# Patient Record
Sex: Female | Born: 1980 | Race: Black or African American | Hispanic: No | Marital: Married | Smoking: Never smoker
Health system: Southern US, Community
[De-identification: ages and names within clinical notes are randomized; demographics above are authoritative.]

## PROBLEM LIST (undated history)

## (undated) DIAGNOSIS — G4730 Sleep apnea, unspecified: Secondary | ICD-10-CM

## (undated) DIAGNOSIS — Z973 Presence of spectacles and contact lenses: Secondary | ICD-10-CM

## (undated) DIAGNOSIS — I499 Cardiac arrhythmia, unspecified: Secondary | ICD-10-CM

## (undated) DIAGNOSIS — F319 Bipolar disorder, unspecified: Secondary | ICD-10-CM

## (undated) DIAGNOSIS — F419 Anxiety disorder, unspecified: Secondary | ICD-10-CM

## (undated) DIAGNOSIS — M1990 Unspecified osteoarthritis, unspecified site: Secondary | ICD-10-CM

## (undated) DIAGNOSIS — F32A Depression, unspecified: Secondary | ICD-10-CM

## (undated) DIAGNOSIS — Z87442 Personal history of urinary calculi: Secondary | ICD-10-CM

## (undated) HISTORY — PX: LIPOMA RESECTION: SHX23

## (undated) HISTORY — PX: CERVICAL CERCLAGE: SHX1329

## (undated) HISTORY — PX: ABLATION: SHX5711

---

## 2018-02-11 ENCOUNTER — Ambulatory Visit (HOSPITAL_COMMUNITY)
Admission: EM | Admit: 2018-02-11 | Discharge: 2018-02-11 | Disposition: A | Discharge status: Home / Self Care | Payer: BLUE CROSS/BLUE SHIELD | Attending: Family Medicine | Admitting: Family Medicine

## 2018-02-11 ENCOUNTER — Encounter (HOSPITAL_COMMUNITY): Payer: Self-pay | Admitting: Emergency Medicine

## 2018-02-11 DIAGNOSIS — J209 Acute bronchitis, unspecified: Secondary | ICD-10-CM

## 2018-02-11 MED ORDER — HYDROCODONE-HOMATROPINE 5-1.5 MG/5ML PO SYRP: 5.0000 mL | ORAL_SOLUTION | Freq: Every evening | ORAL | 0 refills | Status: DC | PRN

## 2018-02-11 MED ORDER — PREDNISONE 50 MG PO TABS: 50.0000 mg | ORAL_TABLET | Freq: Every day | ORAL | 0 refills | Status: AC

## 2018-02-11 MED ORDER — DOXYCYCLINE HYCLATE 100 MG PO CAPS: 100.0000 mg | ORAL_CAPSULE | Freq: Two times a day (BID) | ORAL | 0 refills | Status: AC

## 2018-02-11 MED ORDER — ALBUTEROL SULFATE HFA 108 (90 BASE) MCG/ACT IN AERS: 1.0000 | INHALATION_SPRAY | Freq: Four times a day (QID) | RESPIRATORY_TRACT | 0 refills | Status: DC | PRN

## 2018-02-11 NOTE — Discharge Instructions (Signed)
Please begin prednisone daily with food for the next 5 days to help with cough and inflammation and chest Albuterol inhaler 1 to 2 puffs as needed every 4-6 hours for shortness of breath, chest tightness, wheezing May use cough syrup provided at bedtime to help with rest, this will cause sleepiness, do not use during the day, while working or driving Please use Robitussin, Delsym, Robitussin-DM during the day for cough Please use the above consistently over the next 3 days, may fill prescription for antibiotic on Sunday if not having any improvement with the above  Please follow-up if developing fever, worsening shortness of breath, difficulty breathing, and chest pain, persistent symptoms

## 2018-02-11 NOTE — ED Provider Notes (Signed)
[facility]    CSN: [id number] Arrival date & time: 02/11/18  1757     History   Chief Complaint Chief Complaint  Patient presents with  . Cough    HPI Erica Neal is a 37 y.o. female no significant past medical history presenting today for evaluation of a cough.  Patient has had a cough for approximately 1 week.  She has noticed it worse at night.  Over the past 2 to 3 days she has had increasing mucus production has developed wheezing and rattling in her chest.  The wheezing comes and goes.  She states that she has minimal head symptoms including congestion, rhinorrhea and sore throat.  Denies fevers.  She has tried Mucinex, Delsym, Robitussin and Sudafed over-the-counter without relief.  Her main complaint is poor sleep due to cough and feeling fatigued.  Denies chest pain.  Denies nausea or vomiting.  Denies history of smoking or asthma.  HPI  History reviewed. No pertinent past medical history.  There are no active problems to display for this patient.   History reviewed. No pertinent surgical history.  OB History   None      Home Medications    Prior to Admission medications   Medication Sig Start Date End Date Taking? Authorizing Provider  albuterol (PROVENTIL HFA;VENTOLIN HFA) 108 (90 Base) MCG/ACT inhaler Inhale 1-2 puffs into the lungs every 6 (six) hours as needed for wheezing or shortness of breath. 02/11/18   Margaruite Top C, PA-Neal  doxycycline (VIBRAMYCIN) 100 MG capsule Take 1 capsule (100 mg total) by mouth 2 (two) times daily for 7 days. 02/11/18 02/18/18  Yavier Snider C, PA-Neal  HYDROcodone-homatropine (HYCODAN) 5-1.5 MG/5ML syrup Take 5 mLs by mouth at bedtime as needed for cough. 02/11/18   Malary Aylesworth C, PA-Neal  predniSONE (DELTASONE) 50 MG tablet Take 1 tablet (50 mg total) by mouth daily for 5 days. 02/11/18 02/16/18  Micheal Sheen, Elesa Hacker, PA-Neal    Family History Family History  Problem Relation Age of Onset  . Cancer Father   .  Diabetes Maternal Grandmother     Social History Social History   Tobacco Use  . Smoking status: Never Smoker  Substance Use Topics  . Alcohol use: Yes  . Drug use: Never     Allergies   Erythromycin and Macrobid [nitrofurantoin]   Review of Systems Review of Systems  Constitutional: Positive for fatigue. Negative for activity change, appetite change, chills and fever.  HENT: Negative for congestion, ear pain, rhinorrhea, sinus pressure, sore throat and trouble swallowing.   Eyes: Negative for discharge and redness.  Respiratory: Positive for cough and wheezing. Negative for chest tightness and shortness of breath.   Cardiovascular: Negative for chest pain.  Gastrointestinal: Negative for abdominal pain, diarrhea, nausea and vomiting.  Musculoskeletal: Negative for myalgias.  Skin: Negative for rash.  Neurological: Negative for dizziness, light-headedness and headaches.     Physical Exam Triage Vital Signs ED Triage Vitals [02/11/18 1810]  Enc Vitals Group     BP (!) 143/74     Pulse Rate 79     Resp 16     Temp 98.3 F (36.8 Neal)     Temp src      SpO2 100 %     Weight      Height      Head Circumference      Peak Flow      Pain Score 0     Pain Loc  Pain Edu?      Excl. in [place]?    No data found.  Updated Vital Signs BP (!) 143/74   Pulse 79   Temp 98.3 F (36.8 Neal)   Resp 16   LMP 01/22/2018   SpO2 100%   Visual Acuity Right Eye Distance:   Left Eye Distance:   Bilateral Distance:    Right Eye Near:   Left Eye Near:    Bilateral Near:     Physical Exam  Constitutional: She appears well-developed and well-nourished. No distress.  HENT:  Head: Normocephalic and atraumatic.  Bilateral ears without tenderness to palpation of external auricle, tragus and mastoid, EAC's without erythema or swelling, TM's with good bony landmarks and cone of light. Non erythematous.  Nasal mucosa pink without rhinorrhea  Oral mucosa pink and moist, no  tonsillar enlargement or exudate. Posterior pharynx patent and nonerythematous, no uvula deviation or swelling. Normal phonation.  Eyes: Conjunctivae are normal.  Neck: Neck supple.  Cardiovascular: Normal rate and regular rhythm.  No murmur heard. Pulmonary/Chest: Effort normal and breath sounds normal. No respiratory distress.  Frequent bronchospasm/cough with inspiration in room with wheezing, but no wheezing or other adventitious sounds auscultated with inspiration  Abdominal: Soft. There is no tenderness.  Musculoskeletal: She exhibits no edema.  Neurological: She is alert.  Skin: Skin is warm and dry.  Psychiatric: She has a normal mood and affect.  Nursing note and vitals reviewed.    UC Treatments / Results  Labs (all labs ordered are listed, but only abnormal results are displayed) Labs Reviewed - No data to display  EKG None  Radiology No results found.  Procedures Procedures (including critical care time)  Medications Ordered in UC Medications - No data to display  Initial Impression / Assessment and Plan / UC Course  I have reviewed the triage vital signs and the nursing notes.  Pertinent labs & imaging results that were available during my care of the patient were reviewed by me and considered in my medical decision making (see chart for details).    Patient likely with bronchitis, symptoms seem concentrated at the chest, vital signs stable, no rales or crackles auscultated, will treat for bronchitis with prednisone, albuterol and cough syrup.  Did provide Hycodan to use only at bedtime.  Robitussin and Delsym during the day.  Provided printed prescription of doxycycline as patient allergic to erythromycin; was instructed to fill in 3 to 4 days if not having improvement with above.  Continue to monitor symptoms, breathing, Discussed strict return precautions. Patient verbalized understanding and is agreeable with plan.   Final Clinical Impressions(s) / UC  Diagnoses   Final diagnoses:  Acute bronchitis, unspecified organism     Discharge Instructions     Please begin prednisone daily with food for the next 5 days to help with cough and inflammation and chest Albuterol inhaler 1 to 2 puffs as needed every 4-6 hours for shortness of breath, chest tightness, wheezing May use cough syrup provided at bedtime to help with rest, this will cause sleepiness, do not use during the day, while working or driving Please use Robitussin, Delsym, Robitussin-DM during the day for cough Please use the above consistently over the next 3 days, may fill prescription for antibiotic on Sunday if not having any improvement with the above  Please follow-up if developing fever, worsening shortness of breath, difficulty breathing, and chest pain, persistent symptoms   ED Prescriptions    Medication Sig Dispense Auth. Provider  predniSONE (DELTASONE) 50 MG tablet Take 1 tablet (50 mg total) by mouth daily for 5 days. 5 tablet Erica Tuohey C, PA-Neal   albuterol (PROVENTIL HFA;VENTOLIN HFA) 108 (90 Base) MCG/ACT inhaler Inhale 1-2 puffs into the lungs every 6 (six) hours as needed for wheezing or shortness of breath. 1 Inhaler Jaedyn Marrufo C, PA-Neal   HYDROcodone-homatropine (HYCODAN) 5-1.5 MG/5ML syrup Take 5 mLs by mouth at bedtime as needed for cough. 60 mL Damonie Furney C, PA-Neal   doxycycline (VIBRAMYCIN) 100 MG capsule Take 1 capsule (100 mg total) by mouth 2 (two) times daily for 7 days. 14 capsule Melenda Bielak C, PA-Neal     Controlled Substance Prescriptions [place] Controlled Substance Registry consulted? Not Applicable   Janith Lima, [place] 02/11/18 1903

## 2018-02-11 NOTE — ED Triage Notes (Signed)
Pt c/o cough x1 week, dry cough.

## 2018-07-12 DIAGNOSIS — R109 Unspecified abdominal pain: Secondary | ICD-10-CM | POA: Diagnosis not present

## 2018-07-12 DIAGNOSIS — Z658 Other specified problems related to psychosocial circumstances: Secondary | ICD-10-CM | POA: Diagnosis not present

## 2018-07-12 DIAGNOSIS — R110 Nausea: Secondary | ICD-10-CM | POA: Diagnosis not present

## 2018-07-12 DIAGNOSIS — M25562 Pain in left knee: Secondary | ICD-10-CM | POA: Diagnosis not present

## 2018-10-06 DIAGNOSIS — L0231 Cutaneous abscess of buttock: Secondary | ICD-10-CM | POA: Diagnosis not present

## 2018-11-10 DIAGNOSIS — Z0000 Encounter for general adult medical examination without abnormal findings: Secondary | ICD-10-CM | POA: Diagnosis not present

## 2018-11-10 DIAGNOSIS — Z01411 Encounter for gynecological examination (general) (routine) with abnormal findings: Secondary | ICD-10-CM | POA: Diagnosis not present

## 2018-11-10 DIAGNOSIS — N946 Dysmenorrhea, unspecified: Secondary | ICD-10-CM | POA: Diagnosis not present

## 2018-11-10 DIAGNOSIS — Z01419 Encounter for gynecological examination (general) (routine) without abnormal findings: Secondary | ICD-10-CM | POA: Diagnosis not present

## 2018-11-10 DIAGNOSIS — Z124 Encounter for screening for malignant neoplasm of cervix: Secondary | ICD-10-CM | POA: Diagnosis not present

## 2018-11-10 DIAGNOSIS — R7309 Other abnormal glucose: Secondary | ICD-10-CM | POA: Diagnosis not present

## 2018-11-10 DIAGNOSIS — G43829 Menstrual migraine, not intractable, without status migrainosus: Secondary | ICD-10-CM | POA: Diagnosis not present

## 2018-11-10 DIAGNOSIS — N943 Premenstrual tension syndrome: Secondary | ICD-10-CM | POA: Diagnosis not present

## 2018-11-10 DIAGNOSIS — Z6841 Body Mass Index (BMI) 40.0 and over, adult: Secondary | ICD-10-CM | POA: Diagnosis not present

## 2018-11-10 DIAGNOSIS — R946 Abnormal results of thyroid function studies: Secondary | ICD-10-CM | POA: Diagnosis not present

## 2018-11-10 DIAGNOSIS — L748 Other eccrine sweat disorders: Secondary | ICD-10-CM | POA: Diagnosis not present

## 2018-11-10 DIAGNOSIS — Z1151 Encounter for screening for human papillomavirus (HPV): Secondary | ICD-10-CM | POA: Diagnosis not present

## 2018-11-29 DIAGNOSIS — K219 Gastro-esophageal reflux disease without esophagitis: Secondary | ICD-10-CM | POA: Diagnosis not present

## 2018-11-29 DIAGNOSIS — M25562 Pain in left knee: Secondary | ICD-10-CM | POA: Diagnosis not present

## 2018-12-06 DIAGNOSIS — M25571 Pain in right ankle and joints of right foot: Secondary | ICD-10-CM | POA: Diagnosis not present

## 2018-12-09 DIAGNOSIS — M25562 Pain in left knee: Secondary | ICD-10-CM | POA: Diagnosis not present

## 2018-12-13 DIAGNOSIS — M2550 Pain in unspecified joint: Secondary | ICD-10-CM | POA: Diagnosis not present

## 2019-01-10 DIAGNOSIS — M2550 Pain in unspecified joint: Secondary | ICD-10-CM | POA: Diagnosis not present

## 2019-01-27 DIAGNOSIS — N920 Excessive and frequent menstruation with regular cycle: Secondary | ICD-10-CM | POA: Diagnosis not present

## 2019-01-27 DIAGNOSIS — R635 Abnormal weight gain: Secondary | ICD-10-CM | POA: Diagnosis not present

## 2019-04-05 DIAGNOSIS — R635 Abnormal weight gain: Secondary | ICD-10-CM | POA: Diagnosis not present

## 2019-04-05 DIAGNOSIS — N951 Menopausal and female climacteric states: Secondary | ICD-10-CM | POA: Diagnosis not present

## 2019-04-05 DIAGNOSIS — E559 Vitamin D deficiency, unspecified: Secondary | ICD-10-CM | POA: Diagnosis not present

## 2019-04-05 DIAGNOSIS — R5383 Other fatigue: Secondary | ICD-10-CM | POA: Diagnosis not present

## 2019-04-07 ENCOUNTER — Other Ambulatory Visit: Payer: Self-pay | Admitting: Obstetrics and Gynecology

## 2019-04-07 DIAGNOSIS — Z1331 Encounter for screening for depression: Secondary | ICD-10-CM | POA: Diagnosis not present

## 2019-04-07 DIAGNOSIS — Z1231 Encounter for screening mammogram for malignant neoplasm of breast: Secondary | ICD-10-CM

## 2019-04-07 DIAGNOSIS — R4586 Emotional lability: Secondary | ICD-10-CM | POA: Diagnosis not present

## 2019-04-07 DIAGNOSIS — Z1339 Encounter for screening examination for other mental health and behavioral disorders: Secondary | ICD-10-CM | POA: Diagnosis not present

## 2019-04-07 DIAGNOSIS — R5383 Other fatigue: Secondary | ICD-10-CM | POA: Diagnosis not present

## 2019-04-07 DIAGNOSIS — E559 Vitamin D deficiency, unspecified: Secondary | ICD-10-CM | POA: Diagnosis not present

## 2019-04-07 DIAGNOSIS — N921 Excessive and frequent menstruation with irregular cycle: Secondary | ICD-10-CM | POA: Diagnosis not present

## 2019-04-13 DIAGNOSIS — N644 Mastodynia: Secondary | ICD-10-CM | POA: Diagnosis not present

## 2019-04-13 DIAGNOSIS — F419 Anxiety disorder, unspecified: Secondary | ICD-10-CM | POA: Diagnosis not present

## 2019-05-23 ENCOUNTER — Other Ambulatory Visit: Payer: Self-pay | Admitting: Obstetrics and Gynecology

## 2019-05-23 DIAGNOSIS — N644 Mastodynia: Secondary | ICD-10-CM

## 2019-05-26 ENCOUNTER — Ambulatory Visit: Payer: BLUE CROSS/BLUE SHIELD

## 2019-05-27 ENCOUNTER — Other Ambulatory Visit: Payer: Self-pay

## 2019-05-27 ENCOUNTER — Ambulatory Visit: Payer: Self-pay

## 2019-05-27 ENCOUNTER — Ambulatory Visit
Admission: RE | Admit: 2019-05-27 | Discharge: 2019-05-27 | Disposition: A | Discharge status: Home / Self Care | Payer: BC Managed Care – PPO | Source: Ambulatory Visit | Attending: Obstetrics and Gynecology | Admitting: Obstetrics and Gynecology

## 2019-05-27 DIAGNOSIS — N644 Mastodynia: Secondary | ICD-10-CM

## 2019-05-27 DIAGNOSIS — R928 Other abnormal and inconclusive findings on diagnostic imaging of breast: Secondary | ICD-10-CM | POA: Diagnosis not present

## 2019-07-18 DIAGNOSIS — F419 Anxiety disorder, unspecified: Secondary | ICD-10-CM | POA: Diagnosis not present

## 2019-07-29 DIAGNOSIS — G4700 Insomnia, unspecified: Secondary | ICD-10-CM | POA: Diagnosis not present

## 2019-08-22 DIAGNOSIS — R5383 Other fatigue: Secondary | ICD-10-CM | POA: Diagnosis not present

## 2019-08-22 DIAGNOSIS — R5381 Other malaise: Secondary | ICD-10-CM | POA: Diagnosis not present

## 2019-08-22 DIAGNOSIS — L680 Hirsutism: Secondary | ICD-10-CM | POA: Diagnosis not present

## 2019-08-22 DIAGNOSIS — R519 Headache, unspecified: Secondary | ICD-10-CM | POA: Diagnosis not present

## 2019-08-22 DIAGNOSIS — R4586 Emotional lability: Secondary | ICD-10-CM | POA: Diagnosis not present

## 2019-09-07 DIAGNOSIS — R4586 Emotional lability: Secondary | ICD-10-CM | POA: Diagnosis not present

## 2019-09-13 DIAGNOSIS — F339 Major depressive disorder, recurrent, unspecified: Secondary | ICD-10-CM | POA: Diagnosis not present

## 2019-09-13 DIAGNOSIS — F411 Generalized anxiety disorder: Secondary | ICD-10-CM | POA: Diagnosis not present

## 2019-09-14 ENCOUNTER — Emergency Department (EMERGENCY_DEPARTMENT_HOSPITAL)
Admission: EM | Admit: 2019-09-14 | Discharge: 2019-09-15 | Disposition: A | Discharge status: Other Acute Inpatient Hospital | Payer: BC Managed Care – PPO | Source: Home / Self Care | Attending: Emergency Medicine | Admitting: Emergency Medicine

## 2019-09-14 ENCOUNTER — Other Ambulatory Visit: Payer: Self-pay

## 2019-09-14 DIAGNOSIS — G4700 Insomnia, unspecified: Secondary | ICD-10-CM | POA: Diagnosis not present

## 2019-09-14 DIAGNOSIS — F4310 Post-traumatic stress disorder, unspecified: Secondary | ICD-10-CM | POA: Diagnosis not present

## 2019-09-14 DIAGNOSIS — R45851 Suicidal ideations: Secondary | ICD-10-CM

## 2019-09-14 DIAGNOSIS — Z79890 Hormone replacement therapy (postmenopausal): Secondary | ICD-10-CM | POA: Diagnosis not present

## 2019-09-14 DIAGNOSIS — Z881 Allergy status to other antibiotic agents status: Secondary | ICD-10-CM | POA: Diagnosis not present

## 2019-09-14 DIAGNOSIS — F333 Major depressive disorder, recurrent, severe with psychotic symptoms: Secondary | ICD-10-CM | POA: Diagnosis not present

## 2019-09-14 DIAGNOSIS — Z03818 Encounter for observation for suspected exposure to other biological agents ruled out: Secondary | ICD-10-CM | POA: Diagnosis not present

## 2019-09-14 DIAGNOSIS — F419 Anxiety disorder, unspecified: Secondary | ICD-10-CM | POA: Diagnosis not present

## 2019-09-14 DIAGNOSIS — F323 Major depressive disorder, single episode, severe with psychotic features: Secondary | ICD-10-CM | POA: Insufficient documentation

## 2019-09-14 DIAGNOSIS — K219 Gastro-esophageal reflux disease without esophagitis: Secondary | ICD-10-CM | POA: Diagnosis not present

## 2019-09-14 DIAGNOSIS — Z79891 Long term (current) use of opiate analgesic: Secondary | ICD-10-CM | POA: Diagnosis not present

## 2019-09-14 DIAGNOSIS — F1290 Cannabis use, unspecified, uncomplicated: Secondary | ICD-10-CM | POA: Diagnosis not present

## 2019-09-14 DIAGNOSIS — Z7289 Other problems related to lifestyle: Secondary | ICD-10-CM | POA: Diagnosis not present

## 2019-09-14 DIAGNOSIS — F329 Major depressive disorder, single episode, unspecified: Secondary | ICD-10-CM | POA: Diagnosis not present

## 2019-09-14 DIAGNOSIS — Z20822 Contact with and (suspected) exposure to covid-19: Secondary | ICD-10-CM | POA: Insufficient documentation

## 2019-09-14 DIAGNOSIS — F411 Generalized anxiety disorder: Secondary | ICD-10-CM | POA: Insufficient documentation

## 2019-09-14 DIAGNOSIS — Z79899 Other long term (current) drug therapy: Secondary | ICD-10-CM | POA: Diagnosis not present

## 2019-09-14 LAB — COMPREHENSIVE METABOLIC PANEL
ALT: 32 U/L (ref 0–44)
AST: 22 U/L (ref 15–41)
Albumin: 3.9 g/dL (ref 3.5–5.0)
Alkaline Phosphatase: 69 U/L (ref 38–126)
Anion gap: 8 (ref 5–15)
BUN: 9 mg/dL (ref 6–20)
CO2: 26 mmol/L (ref 22–32)
Calcium: 8.9 mg/dL (ref 8.9–10.3)
Chloride: 104 mmol/L (ref 98–111)
Creatinine, Ser: 0.83 mg/dL (ref 0.44–1.00)
GFR calc Af Amer: >60 mL/min (ref >60)
GFR calc non Af Amer: >60 mL/min (ref >60)
Glucose, Bld: 106 mg/dL — ABNORMAL HIGH (ref 70–99)
Potassium: 4.1 mmol/L (ref 3.5–5.1)
Sodium: 138 mmol/L (ref 135–145)
Total Bilirubin: 0.4 mg/dL (ref 0.3–1.2)
Total Protein: 7.2 g/dL (ref 6.5–8.1)

## 2019-09-14 LAB — CBC
HCT: 40 % (ref 36.0–46.0)
Hemoglobin: 13.3 g/dL (ref 12.0–15.0)
MCH: 32.1 pg (ref 26.0–34.0)
MCHC: 33.3 g/dL (ref 30.0–36.0)
MCV: 96.6 fL (ref 80.0–100.0)
Platelets: 245 10*3/uL (ref 150–400)
RBC: 4.14 MIL/uL (ref 3.87–5.11)
RDW: 12.8 % (ref 11.5–15.5)
WBC: 7.5 10*3/uL (ref 4.0–10.5)
nRBC: 0 % (ref 0.0–0.2)

## 2019-09-14 LAB — ETHANOL: Alcohol, Ethyl (B): <10 mg/dL (ref <10)

## 2019-09-14 LAB — ACETAMINOPHEN LEVEL: Acetaminophen (Tylenol), Serum: <10 ug/mL — ABNORMAL LOW (ref 10–30)

## 2019-09-14 LAB — RAPID URINE DRUG SCREEN, HOSP PERFORMED
Amphetamines: NOT DETECTED
Barbiturates: NOT DETECTED
Benzodiazepines: NOT DETECTED
Cocaine: NOT DETECTED
Opiates: NOT DETECTED
Tetrahydrocannabinol: POSITIVE — AB

## 2019-09-14 LAB — SALICYLATE LEVEL: Salicylate Lvl: <7 mg/dL — ABNORMAL LOW (ref 7.0–30.0)

## 2019-09-14 MED ORDER — PANTOPRAZOLE SODIUM 40 MG PO TBEC: 40.0000 mg | DELAYED_RELEASE_TABLET | Freq: Every day | ORAL | Status: DC

## 2019-09-14 MED ORDER — HYDROXYZINE HCL 25 MG PO TABS
75.0000 mg | ORAL_TABLET | Freq: Every day | ORAL | Status: DC | PRN
  Administered 2019-09-14: 75 mg via ORAL
  Filled 2019-09-14: qty 3

## 2019-09-14 MED ORDER — VENLAFAXINE HCL ER 37.5 MG PO CP24
37.5000 mg | ORAL_CAPSULE | Freq: Every day | ORAL | Status: DC
  Administered 2019-09-15: 37.5 mg via ORAL
  Filled 2019-09-14: qty 1

## 2019-09-14 MED ORDER — LORAZEPAM 0.5 MG PO TABS
0.5000 mg | ORAL_TABLET | Freq: Three times a day (TID) | ORAL | Status: DC | PRN
  Administered 2019-09-14: 0.5 mg via ORAL
  Filled 2019-09-14: qty 1

## 2019-09-14 NOTE — ED Triage Notes (Signed)
Patient states she is suicidal with a plan. Patient states she does not have a gun, but knows where she could get one. Patient states she would use a potato to silence the gun fire. Patient states she wants to leave a message for her wife so she would know what  And how to take care of her kids for her.  Patient also states she has auditory and visual hallucinations. Thinks she hears her kids calling her and states she sees images walking toward  Her.  Patient denies any alcohol or drug use.

## 2019-09-14 NOTE — ED Notes (Signed)
Pt to room 30. Pt alert, calm, cooperative, quiet, guarded.

## 2019-09-14 NOTE — Progress Notes (Signed)
Escorted Erica Neal from room 30 to the [facility]  at 1945 hrs. Her belongings were were placed in the appropriate locker. She continued to endorsed feeling anxious and was medicated per order. She slept throughout the night after talking with TTS.

## 2019-09-14 NOTE — ED Provider Notes (Signed)
[facility] DEPT Provider Note   CSN: [id number] Arrival date & time: 09/14/19  1716     History Chief Complaint  Patient presents with  . Suicidal    Erica Neal.  The history is provided by the patient and medical records.  Altered Mental Status Presenting symptoms: behavior changes   Presenting symptoms: no confusion   Severity:  Severe Most recent episode:  Today Episode history:  Continuous Timing:  Constant Progression:  Worsening Chronicity:  New Associated symptoms: depression and hallucinations   Associated symptoms: no abdominal pain, no agitation, no fever, no headaches, no light-headedness, no nausea, no vomiting and no weakness        No past medical history on file.  There are no problems to display for this patient.   No past surgical history on file.   OB History   No obstetric history on file.     Family History  Problem Relation Age of Onset  . Cancer Father   . Diabetes Maternal Grandmother     Social History   Tobacco Use  . Smoking status: Never Smoker  Substance Use Topics  . Alcohol use: Yes  . Drug use: Never    Home Medications Prior to Admission medications   Medication Sig Start Date End Date Taking? Authorizing Provider  albuterol (PROVENTIL HFA;VENTOLIN HFA) 108 (90 Base) MCG/ACT inhaler Inhale 1-2 puffs into the lungs every 6 (six) hours as needed for wheezing or shortness of breath. 02/11/18   Wieters, Hallie C, PA-C  HYDROcodone-homatropine (HYCODAN) 5-1.5 MG/5ML syrup Take 5 mLs by mouth at bedtime as needed for cough. 02/11/18   Wieters, Hallie C, PA-C    Allergies    Erythromycin and Macrobid [nitrofurantoin]  Review of Systems   Review of Systems  Constitutional: Negative for chills, diaphoresis, fatigue and fever.  HENT: Negative for congestion.   Eyes: Negative for photophobia and visual disturbance.  Respiratory: Negative for cough and chest  tightness.   Gastrointestinal: Negative for abdominal pain, constipation, diarrhea, nausea and vomiting.  Genitourinary: Negative for dysuria.  Musculoskeletal: Negative for back pain, neck pain and neck stiffness.  Skin: Negative for wound.  Neurological: Negative for weakness, light-headedness and headaches.  Psychiatric/Behavioral: Positive for hallucinations, sleep disturbance and suicidal ideas. Negative for agitation, confusion and decreased concentration. The patient is nervous/anxious.   All other systems reviewed and are negative.   Physical Exam Updated Vital Signs BP 123/84 (BP Location: Left Arm)   Pulse (!) 102   Temp (!) 97.4 F (36.3 C) (Oral)   Resp 14   Ht 5\' 1"  (1.549 m)   Wt 109.3 kg   LMP 07/15/2019 (Approximate)   SpO2 100%   BMI 45.54 kg/m   Physical Exam Vitals and nursing note reviewed.  Constitutional:      General: She is not in acute distress.    Appearance: She is well-developed. She is not diaphoretic.  HENT:     Head: Normocephalic and atraumatic.     Right Ear: External ear normal.     Left Ear: External ear normal.     Nose: Nose normal.     Mouth/Throat:     Pharynx: No oropharyngeal exudate.  Eyes:     Conjunctiva/sclera: Conjunctivae normal.     Pupils: Pupils are equal, round, and reactive to light.  Pulmonary:     Effort: No respiratory distress.     Breath sounds: No stridor.  Abdominal:     General:  There is no distension.     Tenderness: There is no abdominal tenderness. There is no rebound.  Musculoskeletal:     Cervical back: Normal range of motion and neck supple.  Skin:    General: Skin is warm.     Findings: No erythema or rash.  Neurological:     Mental Status: She is alert and oriented to person, place, and time.     Motor: No abnormal muscle tone.     Coordination: Coordination normal.     Deep Tendon Reflexes: Reflexes are normal and symmetric.  Psychiatric:        Attention and Perception: She perceives auditory  and visual hallucinations.        Mood and Affect: Mood is anxious. Affect is tearful.        Thought Content: Thought content is not paranoid or delusional. Thought content includes suicidal ideation. Thought content does not include homicidal ideation. Thought content includes suicidal plan. Thought content does not include homicidal plan.     ED Results / Procedures / Treatments   Labs (all labs ordered are listed, but only abnormal results are displayed) Labs Reviewed  COMPREHENSIVE METABOLIC PANEL  ETHANOL  SALICYLATE LEVEL  ACETAMINOPHEN LEVEL  CBC  RAPID URINE DRUG SCREEN, HOSP PERFORMED  I-STAT BETA HCG BLOOD, ED ([facility], [facility], [facility] ONLY)    EKG None  Radiology No results found.  Procedures Procedures (including critical care time)  Medications Ordered in ED Medications  hydrOXYzine (ATARAX/VISTARIL) tablet 75 mg (75 mg Oral Given 09/14/19 2335)  LORazepam (ATIVAN) tablet 0.5 mg (0.5 mg Oral Given 09/14/19 2335)  pantoprazole (PROTONIX) EC tablet 40 mg (has no administration in time range)  venlafaxine XR (EFFEXOR-XR) 24 hr capsule 37.5 mg (has no administration in time range)    ED Course  I have reviewed the triage vital signs and the nursing notes.  Pertinent labs & imaging results that were available during my care of the patient were reviewed by me and considered in my medical decision making (see chart for details).    MDM Rules/Calculators/A&P                      Erica Neal with no significant past medical history who presents with suicidal ideation and a plan to kill herself.  Patient reports that she has had some anxiety and panic problems for years but was previously primarily managed by her OB/GYN for this.  She says that she saw psychiatrist yesterday and then after that visit started having true suicidal thoughts with a plan overnight.  Patient reports that she has had passive thoughts of suicide of not waking up but then  overnight she developed a plan where she was going to go get a gun and go to her backyard and use an item to silence it and she herself.  She also was getting a plan in place to tell her family and keep her children safe from being exposed to it.  After she was having these planning thoughts, she realized she needed help.  She currently denies any homicidal ideation.  She does say that she has been having audiovisual hallucinations including hearing her name called out when no one is there as well as hearing screens.  She also was seeing some vague shadows in her vision but when she looked she never actually see something there.  She reports no other physical complaints and denies any preceding symptoms.  She  denies any significant alcohol use or drug use other than occasional edibles to help with her anxiety which does not help.  On exam, lungs are clear and chest is nontender.  Abdomen is nontender.  Patient is resting comfortably but is tearful when talking her suicidal plan.  No focal neurologic deficits.  Patient is resting comfortably.  No evidence of trauma and patient denies any previous self-harm in the past.  She denies history of suicide attempt.  She will have screening laboratory testing for medical clearance however I feel she will benefit greatly from TTS evaluation and further management.  At this time, patient is voluntary however, she decided to leave with her plan and her reporting to me that although she does not have a firearm in her possession at home, she reports that there is a firearm somewhere in the house and she feels she can access to firearm if she decides to use it.  TTS consult was placed after work-up was completed.          Laboratory testing was reassuring.  She is medically clear for TTS evaluation and management.  Some home medicines ordered as well as a Covid test.  Anticipate follow-up on psychiatry recommendations.   Final Clinical Impression(s) / ED  Diagnoses Final diagnoses:  Suicidal thoughts    Rx / DC Orders ED Discharge Orders    None      Clinical Impression: 1. Suicidal thoughts     Disposition: Awaiting psychiarty recs  This note was prepared with assistance of Dragon voice recognition software. Occasional wrong-word or sound-a-like substitutions may have occurred due to the inherent limitations of voice recognition software.      Fredricka Kohrs, Gwenyth Allegra, MD 09/15/19 [phone]

## 2019-09-15 ENCOUNTER — Encounter (HOSPITAL_COMMUNITY): Payer: Self-pay | Admitting: Adult Health

## 2019-09-15 ENCOUNTER — Inpatient Hospital Stay (HOSPITAL_COMMUNITY)
Admission: AD | Admit: 2019-09-15 | Discharge: 2019-09-18 | DRG: 881 | Disposition: A | Discharge status: Home / Self Care | Payer: BC Managed Care – PPO | Source: Intra-hospital | Attending: Psychiatry | Admitting: Psychiatry

## 2019-09-15 DIAGNOSIS — Z881 Allergy status to other antibiotic agents status: Secondary | ICD-10-CM | POA: Diagnosis not present

## 2019-09-15 DIAGNOSIS — F419 Anxiety disorder, unspecified: Secondary | ICD-10-CM | POA: Diagnosis not present

## 2019-09-15 DIAGNOSIS — F323 Major depressive disorder, single episode, severe with psychotic features: Secondary | ICD-10-CM | POA: Diagnosis not present

## 2019-09-15 DIAGNOSIS — G4700 Insomnia, unspecified: Secondary | ICD-10-CM | POA: Diagnosis present

## 2019-09-15 DIAGNOSIS — Z79890 Hormone replacement therapy (postmenopausal): Secondary | ICD-10-CM

## 2019-09-15 DIAGNOSIS — F4310 Post-traumatic stress disorder, unspecified: Secondary | ICD-10-CM | POA: Diagnosis present

## 2019-09-15 DIAGNOSIS — K219 Gastro-esophageal reflux disease without esophagitis: Secondary | ICD-10-CM | POA: Diagnosis present

## 2019-09-15 DIAGNOSIS — Z7289 Other problems related to lifestyle: Secondary | ICD-10-CM | POA: Diagnosis not present

## 2019-09-15 DIAGNOSIS — F1290 Cannabis use, unspecified, uncomplicated: Secondary | ICD-10-CM | POA: Diagnosis present

## 2019-09-15 DIAGNOSIS — R45851 Suicidal ideations: Secondary | ICD-10-CM | POA: Diagnosis not present

## 2019-09-15 DIAGNOSIS — Z79891 Long term (current) use of opiate analgesic: Secondary | ICD-10-CM

## 2019-09-15 DIAGNOSIS — Z20822 Contact with and (suspected) exposure to covid-19: Secondary | ICD-10-CM | POA: Diagnosis present

## 2019-09-15 DIAGNOSIS — F329 Major depressive disorder, single episode, unspecified: Secondary | ICD-10-CM | POA: Diagnosis not present

## 2019-09-15 DIAGNOSIS — Z79899 Other long term (current) drug therapy: Secondary | ICD-10-CM | POA: Diagnosis not present

## 2019-09-15 DIAGNOSIS — F333 Major depressive disorder, recurrent, severe with psychotic symptoms: Secondary | ICD-10-CM | POA: Diagnosis not present

## 2019-09-15 HISTORY — DX: Anxiety disorder, unspecified: F41.9

## 2019-09-15 LAB — SARS CORONAVIRUS 2 BY RT PCR (HOSPITAL ORDER, PERFORMED IN ~~LOC~~ HOSPITAL LAB): SARS Coronavirus 2: NEGATIVE

## 2019-09-15 MED ORDER — VENLAFAXINE HCL ER 37.5 MG PO CP24
37.5000 mg | ORAL_CAPSULE | Freq: Every day | ORAL | Status: DC
  Filled 2019-09-15: qty 1

## 2019-09-15 MED ORDER — LORAZEPAM 0.5 MG PO TABS: 0.5000 mg | ORAL_TABLET | Freq: Three times a day (TID) | ORAL | Status: DC | PRN

## 2019-09-15 MED ORDER — MAGNESIUM HYDROXIDE 400 MG/5ML PO SUSP: 30.0000 mL | Freq: Every day | ORAL | Status: DC | PRN

## 2019-09-15 MED ORDER — ACETAMINOPHEN 325 MG PO TABS
650.0000 mg | ORAL_TABLET | Freq: Four times a day (QID) | ORAL | Status: DC | PRN
  Administered 2019-09-16: 650 mg via ORAL
  Filled 2019-09-15: qty 2

## 2019-09-15 MED ORDER — ALUM & MAG HYDROXIDE-SIMETH 200-200-20 MG/5ML PO SUSP: 30.0000 mL | ORAL | Status: DC | PRN

## 2019-09-15 MED ORDER — LORAZEPAM 0.5 MG PO TABS
0.5000 mg | ORAL_TABLET | Freq: Four times a day (QID) | ORAL | Status: DC | PRN
  Administered 2019-09-16 – 2019-09-18 (×4): 0.5 mg via ORAL
  Filled 2019-09-15 (×4): qty 1

## 2019-09-15 MED ORDER — HYDROXYZINE HCL 50 MG PO TABS
75.0000 mg | ORAL_TABLET | Freq: Every day | ORAL | Status: DC | PRN
  Administered 2019-09-15: 75 mg via ORAL
  Filled 2019-09-15: qty 1

## 2019-09-15 MED ORDER — TRAZODONE HCL 50 MG PO TABS
50.0000 mg | ORAL_TABLET | Freq: Every evening | ORAL | Status: DC | PRN
  Administered 2019-09-15 – 2019-09-17 (×3): 50 mg via ORAL
  Filled 2019-09-15 (×3): qty 1

## 2019-09-15 MED ORDER — FLUOXETINE HCL 20 MG PO CAPS
20.0000 mg | ORAL_CAPSULE | Freq: Every day | ORAL | Status: DC
  Administered 2019-09-15 – 2019-09-18 (×4): 20 mg via ORAL
  Filled 2019-09-15 (×7): qty 1

## 2019-09-15 MED ORDER — PANTOPRAZOLE SODIUM 40 MG PO TBEC
40.0000 mg | DELAYED_RELEASE_TABLET | Freq: Every day | ORAL | Status: DC
  Administered 2019-09-16 – 2019-09-17 (×2): 40 mg via ORAL
  Filled 2019-09-15 (×6): qty 1

## 2019-09-15 MED ORDER — ARIPIPRAZOLE 5 MG PO TABS
5.0000 mg | ORAL_TABLET | Freq: Every day | ORAL | Status: DC
  Administered 2019-09-16 – 2019-09-18 (×3): 5 mg via ORAL
  Filled 2019-09-15 (×5): qty 1

## 2019-09-15 NOTE — Progress Notes (Signed)
   09/15/19 1200  Psych Admission Type (Psych Patients Only)  Admission Status Voluntary  Psychosocial Assessment  Patient Complaints Anxiety;Agitation;Appetite decrease;Decreased concentration;Crying spells;Depression;Hopelessness;Irritability;Insomnia;Sadness;Panic attack;Self-harm thoughts;Tension;Shakiness;Worrying;Sleep disturbance  Eye Contact Fair  Facial Expression Anxious;Sad;Worried  Affect Anxious;Depressed;Sad  Speech Logical/coherent  Interaction Assertive  Motor Activity Other (Comment) (WNL)  Appearance/Hygiene Unremarkable  Behavior Characteristics Anxious;Cooperative  Mood Depressed;Anxious;Despair;Sad  Thought Process  Coherency WDL  Content WDL  Delusions None reported or observed  Perception Hallucinations  Hallucination Auditory;Visual  Judgment Impaired  Confusion None  Danger to Self  Current suicidal ideation? Denies  Danger to Others  Danger to Others None reported or observed

## 2019-09-15 NOTE — Tx Team (Signed)
Initial Treatment Plan 09/15/2019 6:22 PM Erica Neal [id number]    PATIENT STRESSORS: Financial difficulties Health problems Loss of father 4 years ago   PATIENT STRENGTHS: Active sense of humor Capable of independent living Motivation for treatment/growth Supportive family/friends   PATIENT IDENTIFIED PROBLEMS: depression  anxiety  Psychosis (AVH)  "I can no longer cook, drive, or work."               DISCHARGE CRITERIA:  Adequate post-discharge living arrangements Improved stabilization in mood, thinking, and/or behavior Medical problems require only outpatient monitoring Verbal commitment to aftercare and medication compliance  PRELIMINARY DISCHARGE PLAN: Attend PHP/IOP Outpatient therapy Return to previous living arrangement  PATIENT/FAMILY INVOLVEMENT: This treatment plan has been presented to and reviewed with the patient, Erica Neal.  The patient and family have been given the opportunity to ask questions and make suggestions.  Harlow Asa, RN 09/15/2019, 6:22 PM

## 2019-09-15 NOTE — ED Notes (Signed)
Meal tray provided.

## 2019-09-15 NOTE — Progress Notes (Signed)
   09/15/19 2130  Psych Admission Type (Psych Patients Only)  Admission Status Voluntary  Psychosocial Assessment  Patient Complaints Sleep disturbance;Depression  Eye Contact Fair  Facial Expression Sad  Affect Depressed  Speech Logical/coherent  Interaction Assertive  Motor Activity Other (Comment) (WNL)  Appearance/Hygiene Unremarkable  Behavior Characteristics Cooperative  Mood Depressed;Pleasant  Thought Process  Coherency WDL  Content WDL  Delusions None reported or observed  Perception WDL  Hallucination None reported or observed  Judgment Impaired  Confusion None  Danger to Self  Current suicidal ideation? Denies  Danger to Others  Danger to Others None reported or observed   Pt denies SI, HI, AVH and pain. Pt given PRN meds for sleep.

## 2019-09-15 NOTE — ED Notes (Signed)
Pt became anxious and tearful when on phone with wife.

## 2019-09-15 NOTE — Progress Notes (Signed)
Admission Note:  Pt is a 39 y.o. presenting voluntarily to [facility] from [facility] ED. Admitted for SI with plan to get a gun and go into her backyard around 4 or 5 o'clock and muffle the sound of the gun before she shoots herself. She said she would go around that specific time because she knows her wife would go out around that time to walk their dog and that she would text her too so she'd see what she had done. She reports that she had planned for everything to be done in silence and she'd ensure that her wife knew to tell the ambulance to not come with there sirens on. Pt said "I don't like to inconvenience others and I no longer want to be a burden." "I can't work, drive, or cook now." "I didn't want a funeral." Pt reports having two degrees and not having been able to work anymore because of her mental state. 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 She reports drinking alcohol 2 to 3 times a year, 1 or 2 glasses of wine. Also reports "edibles" use. 20 grams of THC once every 4 or 5 months.   Pt reports having AVH mainly at night time. She said at home she sees her younger daughter saying mommy or mom. She hears high pitched screams when she lays down to go to bed and is having trouble sleeping as a result. Reports anxiety 7/10. She said that the people she sees aren't clear. She's seen her daughter walking towards her in the living room once, but someone was walking behind her. She couldn't make out who it was.   She lives with her wife and 2 kids, ages 68 and 8. She normally works 40 hours a week, but was out on FMLA due to her younger daughter. She said her daughter has a dilated rectum and has been requiring around the clock care. That's been one of her stressors.  Her two goals are "I want to have the tools to cope when I'm going through physical changes and manage through these changes."    Consents signed with patient. Units rules and policies discussed. Tour provided of unit and q 15 minute safety checks initiated. Belongings secured in locker.

## 2019-09-15 NOTE — ED Notes (Signed)
PT was directed with EMT to lobby, PT belongings were provided to transport, along with PT packet.

## 2019-09-15 NOTE — BH Assessment (Signed)
[facility] Assessment Progress Note  Per Talbot Grumbling, NP, this voluntary pt requires psychiatric hospitalization at this time.  Erica Neal has assigned pt to [facility] Rm 305-1; [facility] will be ready to receive pt at 11:00.   Pt's nurse, Erica Neal, has been notified, and agrees to have pt sign consent forms, to send original paperwork along with pt via [facility], and to call report to [phone].  Erica Neal, [place] Coordinator [phone]

## 2019-09-15 NOTE — [facility] Suicide Risk Assessment (Signed)
[facility] Admission Suicide Risk Assessment   Nursing information obtained from:  Patient Demographic factors:  Gay, lesbian, or bisexual orientation, Low socioeconomic status, Unemployed Current Mental Status:  Suicidal ideation indicated by patient Loss Factors:  Loss of significant relationship, Decline in physical health, Financial problems / change in socioeconomic status Historical Factors:  NA Risk Reduction Factors:  Responsible for children under 80 years of age, Sense of responsibility to family, Living with another person, especially a relative, Positive social support, Positive therapeutic relationship  Total Time spent with patient: 45 minutes Principal Problem: MDD with psychotic features  Diagnosis:  Active Problems:   MDD (major depressive disorder)  Subjective Data:   Continued Clinical Symptoms:    The "Alcohol Use Disorders Identification Test", Guidelines for Use in Primary Care, Second Edition.  World Pharmacologist [facility]). Score between 0-7:  no or low risk or alcohol related problems. Score between 8-15:  moderate risk of alcohol related problems. Score between 16-19:  high risk of alcohol related problems. Score 20 or above:  warrants further diagnostic evaluation for alcohol dependence and treatment.   CLINICAL FACTORS:  39 year old female, presented on 5/26 reporting worsening depression, feelings of hopelessness, recent onset SI, neurovegetative symptoms of depression.  She also reports episodic/brief auditory and visual hallucinations which she describes as hearing her daughter call her name and seeing fleeting shadows.  She reports a long history of having significant mood symptoms on days preceding her menses, and states her OB/GYN specialist initiated hormonal therapy several weeks ago in spite of which psychiatric symptoms have persisted.  Stressors include 68-year-old daughter having chronic GI symptoms/issues and currently being on leave from  work.    Psychiatric Specialty Exam: Physical Exam  Review of Systems  Blood pressure 122/73, pulse 76, temperature 98.6 F (37 C), temperature source Oral, resp. rate 18, height 5\' 1"  (1.549 m), weight 109.3 kg, SpO2 97 %.Body mass index is 45.54 kg/m.  See admit note MSE  COGNITIVE FEATURES THAT CONTRIBUTE TO RISK:  Closed-mindedness and Loss of executive function    SUICIDE RISK:   Moderate:  Frequent suicidal ideation with limited intensity, and duration, some specificity in terms of plans, no associated intent, good self-control, limited dysphoria/symptomatology, some risk factors present, and identifiable protective factors, including available and accessible social support.  PLAN OF CARE: Patient will be admitted to inpatient psychiatric unit for stabilization and safety. Will provide and encourage milieu participation. Provide medication management and maked adjustments as needed.  Will follow daily.    I certify that inpatient services furnished can reasonably be expected to improve the patient's condition.   Jenne Campus, MD 09/15/2019, 6:25 PM

## 2019-09-15 NOTE — ED Notes (Signed)
TTS Consult cart with PT.

## 2019-09-15 NOTE — BH Assessment (Addendum)
Tele Assessment Note   Patient Name: Erica Neal MRN: [id number] Referring Physician: Tegeler, Gwenyth Allegra, MD Location of Patient: [facility] Location of Provider: [place] [facility]  Erica Neal is an 39 y.o. female who presents to the ED voluntarily. Pt reports she has been having thoughts of suicide and had a plan to shoot herself in her backyard. Pt states she was going to send a message to her wife to tell her that she was about to kill herself, and then go to the backyard and shoot herself. Pt states she does not own guns, however she can get access to a gun easily. Pt reports she has been experiencing increased anxiety, panic attacks, and psychosis. Pt states she hears people calling her name and also sees images walking around her house. Pt states for the past 2.5 months her symptoms have been unbearable. Pt states she does not sleep for more than 2-4 hours in a 24 hour period. Pt states her cycle has been the worst she's ever experienced and this is a significant trigger for her depression and SI.   Pt states she lives home with her wife and 2 children. Pt reports she is employed but she has not been going to work due to her depression and anxiety. Pt states she called the [facility] and is currently in the process of establishing ongoing [place] tx. Pt does not appear to be responding to internal stimuli during the assessment. Pt denies HI at present.  Pt is alert and oriented during the assessment. Pt makes appropriate eye contact. Pt mood is depressed and anxious. Pt affect is depressed and flat. Pt judgement is impaired. Pt endorses panic attacks daily. Pt states she sometimes uses edibles. Pt agrees to sign VOL consent for tx but states she wants to be a part of her tx treatment team and not have someone else make her decisions for her.    Talbot Grumbling, NP recommends inpt tx. TTS to seek placement. BH at capacity. EDP Dr. Florina Ou, MD has been  advised.  Diagnosis: MDD, single episode severe, w/ psychosis; GAD, severe  Past Medical History: No past medical history on file.  No past surgical history on file.  Family History:  Family History  Problem Relation Age of Onset  . Cancer Father   . Diabetes Maternal Grandmother     Social History:  reports that she has never smoked. She does not have any smokeless tobacco history on file. She reports current alcohol use. She reports that she does not use drugs.  Additional Social History:  Alcohol / Drug Use Pain Medications: See MAR Prescriptions: See MAR Over the Counter: See MAR History of alcohol / drug use?: No history of alcohol / drug abuse  CIWA: CIWA-Ar BP: 121/75 Pulse Rate: 78 COWS:    Allergies:  Allergies  Allergen Reactions  . Azithromycin Swelling  . Erythromycin     Facial swelling  . Macrobid [Nitrofurantoin]     hives    Home Medications: (Not in a hospital admission)   OB/GYN Status:  Patient's last menstrual period was 07/15/2019 (approximate).  General Assessment Data Location of Assessment: [facility] TTS Assessment: In system Is this a Tele or Face-to-Face Assessment?: Tele Assessment Is this an Initial Assessment or a Re-assessment for this encounter?: Initial Assessment Patient Accompanied by:: N/A Language Other than English: No Living Arrangements: Other (Comment) What gender do you identify as?: Female Marital status: Married Pregnancy Status: No Living Arrangements: Spouse/significant other, Children  Can pt return to current living arrangement?: Yes Admission Status: Voluntary Is patient capable of signing voluntary admission?: Yes Referral Source: Self/Family/Friend Insurance type: [place] Living Arrangements: Spouse/significant other, Children Name of Psychiatrist: [place] Name of Therapist: [place]  Education Status Is patient currently in school?: No Is the patient  employed, unemployed or receiving disability?: Employed  Risk to self with the past 6 months Suicidal Ideation: Yes-Currently Present Has patient been a risk to self within the past 6 months prior to admission? : Yes Suicidal Intent: Yes-Currently Present Has patient had any suicidal intent within the past 6 months prior to admission? : Yes Is patient at risk for suicide?: Yes Suicidal Plan?: Yes-Currently Present Has patient had any suicidal plan within the past 6 months prior to admission? : Yes Specify Current Suicidal Plan: pt had thoughts to shoot herself in her back yard Access to Means: Yes Specify Access to Suicidal Means: pt states she has access to a gun What has been your use of drugs/alcohol within the last 12 months?: edibles Previous Attempts/Gestures: No Triggers for Past Attempts: None known Intentional Self Injurious Behavior: None Family Suicide History: No Recent stressful life event(s): Other (Comment)(anxiety, depression) Persecutory voices/beliefs?: No Depression: Yes Depression Symptoms: Despondent, Insomnia, Feeling worthless/self pity, Loss of interest in usual pleasures, Fatigue, Tearfulness Substance abuse history and/or treatment for substance abuse?: No Suicide prevention information given to non-admitted patients: Not applicable  Risk to Others within the past 6 months Homicidal Ideation: No Does patient have any lifetime risk of violence toward others beyond the six months prior to admission? : No Thoughts of Harm to Others: No Current Homicidal Intent: No Current Homicidal Plan: No Access to Homicidal Means: No History of harm to others?: No Assessment of Violence: None Noted Does patient have access to weapons?: Yes (Comment)(guns) Criminal Charges Pending?: No Does patient have a court date: No Is patient on probation?: No  Psychosis Hallucinations: Auditory, Visual Delusions: None noted  Mental Status Report Appearance/Hygiene:  Disheveled Eye Contact: Good Motor Activity: Restlessness Speech: Logical/coherent Level of Consciousness: Alert Mood: Anxious, Depressed, Despair, Sad Affect: Anxious, Depressed, Sad, Flat Anxiety Level: Panic Attacks Panic attack frequency: weekly Most recent panic attack: 09/14/2019 Thought Processes: Relevant, Coherent Judgement: Impaired Orientation: Place, Person, Time, Appropriate for developmental age, Situation Obsessive Compulsive Thoughts/Behaviors: None  Cognitive Functioning Concentration: Normal Memory: Remote Intact, Recent Intact Is patient IDD: No Insight: Poor Impulse Control: Poor Appetite: Good Have you had any weight changes? : No Change Sleep: Decreased Total Hours of Sleep: 3 Vegetative Symptoms: None  ADLScreening [facility]) Patient's cognitive ability adequate to safely complete daily activities?: Yes Patient able to express need for assistance with ADLs?: Yes Independently performs ADLs?: Yes (appropriate for developmental age)  Prior Inpatient Therapy Prior Inpatient Therapy: No  Prior Outpatient Therapy Prior Outpatient Therapy: Yes Prior Therapy Dates: upcoming appts Prior Therapy Facilty/Provider(s): [facility] Reason for Treatment: Depression Does patient have an ACCT team?: No Does patient have Intensive In-House Services?  : No Does patient have [facility] services? : No Does patient have P4CC services?: No  ADL Screening (condition at time of admission) Patient's cognitive ability adequate to safely complete daily activities?: Yes Is the patient deaf or have difficulty hearing?: No Does the patient have difficulty seeing, even when wearing glasses/contacts?: No Does the patient have difficulty concentrating, remembering, or making decisions?: No Patient able to express need for assistance with ADLs?: Yes Does the  patient have difficulty dressing or bathing?: No Independently performs ADLs?: Yes (appropriate  for developmental age) Does the patient have difficulty walking or climbing stairs?: No Weakness of Legs: None Weakness of Arms/Hands: None  Home Assistive Devices/Equipment Home Assistive Devices/Equipment: None    Abuse/Neglect Assessment (Assessment to be complete while patient is alone) Abuse/Neglect Assessment Can Be Completed: Yes Physical Abuse: Denies Verbal Abuse: Denies Sexual Abuse: Denies Exploitation of patient/patient's resources: Denies Self-Neglect: Denies     Regulatory affairs officer (For Healthcare) Does Patient Have a Medical Advance Directive?: No Would patient like information on creating a medical advance directive?: No - Patient declined          Disposition: Adaku Anike, NP recommends inpt tx. TTS to seek placement. BH at capacity.  Disposition Initial Assessment Completed for this Encounter: Yes Disposition of Patient: Admit Type of inpatient treatment program: Adult Patient refused recommended treatment: No  This service was provided via telemedicine using a 2-way, interactive audio and video technology.  Names of all persons participating in this telemedicine service and their role in this encounter. Name:  Sanjuana Spieker Role: Patient  Name: Lind Covert, LCSW Role: TTS          Lyanne Co 09/15/2019 4:43 AM

## 2019-09-15 NOTE — ED Notes (Signed)
Pt off unit to [facility] per provider. Pt alert, calm, cooperative, no s/s of distress. DC information and belongings given to [employer]. Pt ambulatory off unit, escorted by NT. Pt transported by [employer].

## 2019-09-15 NOTE — H&P (Signed)
Psychiatric Admission Assessment Adult  Patient Identification: Erica Neal MRN:  [id number] Date of Evaluation:  09/15/2019 Chief Complaint: " I feel I am at a point where I cannot manage this myself" Principal Diagnosis: MDD, with psychotic features  Diagnosis:  Active Problems:   MDD (major depressive disorder)  History of Present Illness: 39 year old female. She presented to hospital on 5/26 at the recommendation of her OB/Gyn specialist,  reporting depression, feeling hopeless, recent suicidal ideations, states " I have been feeling my family might be better off without me" over the last several days. Two days ago she developed thoughts of shooting self ( reports she does not have ready access to a firearm) . She reports she has been experiencing symptoms of anxiety, depression intermittently related to menstrual cycle, with increased symptoms on days preceding her cycle. States " I would start feeling worse like a week before my cycle , I would crash, feel irritable, unable to sleep, tired and anxious".  States she has had these symptoms for several years but feels they have been getting worse. She reports she has been in treatment with her OBGyn MD and started hormonal therapy 2 months ago. States that although this caused amenorrhea, psychiatric symptoms have persisted  Endorses neuro-vegetative symptoms as below. Also reports vague auditory ( mainly when dozing off/trying to sleep) and fleeting visual hallucinations ( shadows) over the last two weeks or so. In addition to depression she also reports increased anxiety , feeling panicky at times, and often nauseous .  Endorses some contributing stressors- she is currently taking time off work. Her 80 y old daughter has chronic digestive issues/constipation for which she requires frequent care/ medical visits  Associated Signs/Symptoms: Depression Symptoms:  depressed mood, anhedonia, insomnia, suicidal thoughts without  plan, suicidal thoughts with specific plan, anxiety, loss of energy/fatigue, decreased appetite,  Reports she has also become more socially avoidant  (Hypo) Manic Symptoms:  None noted, reports she feels subjectively irritable  Anxiety Symptoms: reports increased anxiety recently Psychotic Symptoms:  Reports she has been experiencing brief auditory hallucinations ( hears crying , calling her name) . These occur mainly when she is trying to sleep or is dozing off. At this time does not appear internally preoccupied. She also reports she has been experiencing fleeting shadows at times, which she attributes to poor sleep.  PTSD Symptoms: Reports her daughter was born at 25 weeks and her initial weeks were spent in NICU. She reports some ongoing intrusive memories about this. Does not endorse other PTSD symptoms. Total Time spent with patient: 45 minutes  Past Psychiatric History: no prior psychiatric admissions, denies past history of suicide attempts or of self injurious behaviors. She denies prior episodes of severe depression, denies history of postpartum depression. Denies history of mania or hypomania. Denies history of psychosis. Does not endorse PTSD history. Denies prior history of panic or agoraphobia.   Is the patient at risk to self? Yes.    Has the patient been a risk to self in the past 6 months? No.  Has the patient been a risk to self within the distant past? No.  Is the patient a risk to others? No.  Has the patient been a risk to others in the past 6 months? No.  Has the patient been a risk to others within the distant past? No.   Prior Inpatient Therapy:  Denies  Prior Outpatient Therapy:  Dr. Burt Knack at [facility]   Alcohol Screening:   Substance Abuse  History in the last 12 months:  Denies alcohol or drug abuse  Consequences of Substance Abuse: Denies  Previous Psychotropic Medications: She reports she had been on Effexor XR  37.5 mgrs QDAY , started about a  month ago. Yesterday before admission had seen her psychiatrist and had been started on Prozac and Lorazepam but states she had not started taking these yet . Reports that over the last several months has been tried on different medications, remembers Zoloft, Klonopin, Trazodone. States she does not feel any of these worked for her.  Psychological Evaluations: No  Past Medical History: denies history of medical illnesses , allergic to erythromycin and Macrobid. Takes Pantoprazole for GERD , Norethindrone for hormonal management of perimenopausal symptoms,  Family History: reports she did not know her biological father, mother is alive . Bio father died of pancreatic cancer . Stepfather died in 08/09/16 from CVA.  Has one sister and a half sister . Family History  Problem Relation Age of Onset  . Cancer Father   . Diabetes Maternal Grandmother    Family Psychiatric  History: no family history of mental illness . No suicides in family Tobacco Screening:  does not smoke or vape Social History: 85, married, has two children ( 16,5) , she is employed but currently taking time off work Social History   Substance and Sexual Activity  Alcohol Use Yes     Social History   Substance and Sexual Activity  Drug Use Never    Additional Social History:  Allergies:   Allergies  Allergen Reactions  . Azithromycin Swelling  . Erythromycin     Facial swelling  . Macrobid [Nitrofurantoin]     hives   Lab Results:  Results for orders placed or performed during the hospital encounter of 09/14/19 (from the past 48 hour(s))  Rapid urine drug screen (hospital performed)     Status: Abnormal   Collection Time: 09/14/19  6:00 PM  Result Value Ref Range   Opiates NONE DETECTED NONE DETECTED   Cocaine NONE DETECTED NONE DETECTED   Benzodiazepines NONE DETECTED NONE DETECTED   Amphetamines NONE DETECTED NONE DETECTED   Tetrahydrocannabinol POSITIVE (A) NONE DETECTED   Barbiturates NONE DETECTED NONE  DETECTED    Comment: (NOTE) DRUG SCREEN FOR MEDICAL PURPOSES ONLY.  IF CONFIRMATION IS NEEDED FOR ANY PURPOSE, NOTIFY LAB WITHIN 5 DAYS. LOWEST DETECTABLE LIMITS FOR URINE DRUG SCREEN Drug Class                     Cutoff (ng/mL) Amphetamine and metabolites    1000 Barbiturate and metabolites    200 Benzodiazepine                 [id number] Tricyclics and metabolites     300 Opiates and metabolites        300 Cocaine and metabolites        300 THC                            50 Performed at [facility], [place] [address], [place], [place] [address]   Comprehensive metabolic panel     Status: Abnormal   Collection Time: 09/14/19  7:37 PM  Result Value Ref Range   Sodium 138 135 - 145 mmol/L   Potassium 4.1 3.5 - 5.1 mmol/L   Chloride 104 98 - 111 mmol/L   CO2 26 22 - 32 mmol/L   Glucose, Bld 106 (  H) 70 - 99 mg/dL    Comment: Glucose reference range applies only to samples taken after fasting for at least 8 hours.   BUN 9 6 - 20 mg/dL   Creatinine, Ser 0.83 0.44 - 1.00 mg/dL   Calcium 8.9 8.9 - 10.3 mg/dL   Total Protein 7.2 6.5 - 8.1 g/dL   Albumin 3.9 3.5 - 5.0 g/dL   AST 22 15 - 41 U/L   ALT 32 0 - 44 U/L   Alkaline Phosphatase 69 38 - 126 U/L   Total Bilirubin 0.4 0.3 - 1.2 mg/dL   GFR calc non Af Amer >60 >60 mL/min   GFR calc Af Amer >60 >60 mL/min   Anion gap 8 5 - 15    Comment: Performed at [facility], [place] [address], [place], [place] [address]  Ethanol     Status: None   Collection Time: 09/14/19  7:37 PM  Result Value Ref Range   Alcohol, Ethyl (B) <10 <10 mg/dL    Comment: (NOTE) Lowest detectable limit for serum alcohol is 10 mg/dL. For medical purposes only. Performed at [facility], [place] [address] [place], [place] [id number]   Salicylate level     Status: Abnormal   Collection Time: 09/14/19  7:37 PM  Result Value Ref Range   Salicylate Lvl [id number] (L) 7.0 - 30.0 mg/dL    Comment: Performed at [facility], [place] [address], [place], [place] [address]  Acetaminophen level     Status: Abnormal   Collection Time: 09/14/19  7:37 PM  Result Value Ref Range   Acetaminophen (Tylenol), Serum <10 (L) 10 - 30 ug/mL    Comment: (NOTE) Therapeutic concentrations vary significantly. A range of 10-30 ug/mL  may be an effective concentration for many patients. However, some  are best treated at concentrations outside of this range. Acetaminophen concentrations >150 ug/mL at 4 hours after ingestion  and >50 ug/mL at 12 hours after ingestion are often associated with  toxic reactions. Performed at [facility], [place] [address], [place], [place] [address]   cbc     Status: None   Collection Time: 09/14/19  7:37 PM  Result Value Ref Range   WBC 7.5 4.0 - 10.5 K/uL   RBC 4.14 3.87 - 5.11 MIL/uL   Hemoglobin 13.3 12.0 - 15.0 g/dL   HCT 40.0 36.0 - 46.0 %   MCV 96.6 80.0 - 100.0 fL   MCH 32.1 26.0 - 34.0 pg   MCHC 33.3 30.0 - 36.0 g/dL   RDW 12.8 11.5 - 15.5 %   Platelets 245 150 - 400 K/uL   nRBC 0.0 0.0 - 0.2 %    Comment: Performed at [facility], [place] [address] [place], [place] [address]  SARS Coronavirus 2 by RT PCR (hospital order, performed in [facility] hospital lab) Nasopharyngeal Nasopharyngeal Swab     Status: None   Collection Time: 09/14/19 11:27 PM   Specimen: Nasopharyngeal Swab  Result Value Ref Range   SARS Coronavirus 2 NEGATIVE NEGATIVE    Comment: (NOTE) SARS-CoV-2 target nucleic acids are NOT DETECTED. The SARS-CoV-2 RNA is generally detectable in upper and lower respiratory specimens during the acute phase of infection. The lowest concentration of SARS-CoV-2 viral copies this assay can detect is 250 copies / mL. A negative result does not preclude SARS-CoV-2 infection and should not be used as the sole basis for treatment or other patient management decisions.  A  negative result may occur with improper specimen  collection / handling, submission of specimen other than nasopharyngeal swab, presence of viral mutation(s) within the areas targeted by this assay, and inadequate number of viral copies (<250 copies / mL). A negative result must be combined with clinical observations, patient history, and epidemiological information. Fact Sheet for Patients:   StrictlyIdeas.no Fact Sheet for Healthcare Providers: BankingDealers.co.za This test is not yet approved or cleared  by the [place] FDA and has been authorized for detection and/or diagnosis of SARS-CoV-2 by FDA under an Emergency Use Authorization (EUA).  This EUA will remain in effect (meaning this test can be used) for the duration of the COVID-19 declaration under Section 564(b)(1) of the Act, 21 U.S.C. section 360bbb-3(b)(1), unless the authorization is terminated or revoked sooner. Performed at [facility], [place] [address] [place], [place] [address]     Blood Alcohol level:  Lab Results  Component Value Date   ETH <10 [id number]    Metabolic Disorder Labs:  No results found for: HGBA1C, MPG No results found for: PROLACTIN No results found for: CHOL, TRIG, HDL, CHOLHDL, VLDL, LDLCALC  Current Medications: Current Facility-Administered Medications  Medication Dose Route Frequency Provider Last Rate Last Admin  . acetaminophen (TYLENOL) tablet 650 mg  650 mg Oral Q6H PRN Merlyn Lot E, NP      . alum & mag hydroxide-simeth (MAALOX/MYLANTA) 200-200-20 MG/5ML suspension 30 mL  30 mL Oral Q4H PRN Merlyn Lot E, NP      . hydrOXYzine (ATARAX/VISTARIL) tablet 75 mg  75 mg Oral Daily PRN Merlyn Lot E, NP   75 mg at 09/15/19 1727  . LORazepam (ATIVAN) tablet 0.5 mg  0.5 mg Oral TID PRN Merlyn Lot E, NP      . magnesium hydroxide (MILK OF MAGNESIA) suspension 30 mL  30 mL Oral Daily PRN Mallie Darting, NP      . Derrill Memo ON 09/16/2019] pantoprazole (PROTONIX) EC  tablet 40 mg  40 mg Oral Daily Merlyn Lot E, NP      . traZODone (DESYREL) tablet 50 mg  50 mg Oral QHS PRN Mallie Darting, NP      . Derrill Memo ON 09/16/2019] venlafaxine XR (EFFEXOR-XR) 24 hr capsule 37.5 mg  37.5 mg Oral Daily Merlyn Lot E, NP       PTA Medications: Medications Prior to Admission  Medication Sig Dispense Refill Last Dose  . albuterol (PROVENTIL HFA;VENTOLIN HFA) 108 (90 Base) MCG/ACT inhaler Inhale 1-2 puffs into the lungs every 6 (six) hours as needed for wheezing or shortness of breath. 1 Inhaler 0   . clonazePAM (KLONOPIN) 1 MG tablet Take 1 mg by mouth daily.     Marland Kitchen dimenhyDRINATE (DRAMAMINE) 50 MG tablet Take 25 mg by mouth every 8 (eight) hours as needed for nausea or dizziness.     Marland Kitchen FLUoxetine (PROZAC) 20 MG tablet Take 20 mg by mouth daily.      Marland Kitchen HYDROcodone-homatropine (HYCODAN) 5-1.5 MG/5ML syrup Take 5 mLs by mouth at bedtime as needed for cough. 60 mL 0   . hydrOXYzine (ATARAX/VISTARIL) 25 MG tablet Take 75 mg by mouth daily as needed for anxiety.     Marland Kitchen LORazepam (ATIVAN) 0.5 MG tablet Take 0.5 mg by mouth 3 (three) times daily as needed for anxiety or sleep.      . norethindrone (AYGESTIN) 5 MG tablet Take 10 mg by mouth daily.     . pantoprazole (PROTONIX) 40 MG tablet Take 40 mg  by mouth daily.     . QUEtiapine (SEROQUEL) 100 MG tablet Take 100-200 mg by mouth at bedtime.     . sertraline (ZOLOFT) 100 MG tablet Take 100 mg by mouth daily.      . traZODone (DESYREL) 50 MG tablet Take 50-100 mg by mouth at bedtime as needed for sleep.      Marland Kitchen venlafaxine XR (EFFEXOR-XR) 37.5 MG 24 hr capsule Take 37.5 mg by mouth daily.     Marland Kitchen zolpidem (AMBIEN) 5 MG tablet Take 5 mg by mouth daily.       Musculoskeletal: Strength & Muscle Tone: within normal limits Gait & Station: normal Patient leans: N/A  Psychiatric Specialty Exam: Physical Exam  Review of Systems  Constitutional: Negative.   HENT: Negative.   Respiratory: Negative.  Negative for cough and  shortness of breath.   Cardiovascular: Negative for chest pain.  Gastrointestinal: Positive for nausea.  Endocrine: Negative.   Genitourinary: Negative.   Musculoskeletal: Negative.   Skin: Negative for rash.  Neurological: Positive for headaches. Negative for seizures.       Reports history of migraine headaches   Psychiatric/Behavioral:       Depression    Blood pressure 122/73, pulse 76, temperature 98.6 F (37 C), temperature source Oral, resp. rate 18, height 5\' 1"  (1.549 m), weight 109.3 kg, SpO2 97 %.Body mass index is 45.54 kg/m.  General Appearance: Well Groomed  Eye Contact:  Good  Speech:  Normal Rate  Volume:  Normal  Mood:  Depressed  Affect:  Constricted and intermittently tearful during session  Thought Process:  Linear and Descriptions of Associations: Intact  Orientation:  Other:  fully alert and attentive  Thought Content:  reports intermittent auditory hallucinations, reports she hears daughter calling her when she is attempting to fall asleep. Currently does not present internally preoccupied,no delusions are expressed.   Suicidal Thoughts:  No denies current suicidal plan or intention and contracts for safety  Homicidal Thoughts:  No denies HI or violent ideations  Memory:  recent and remote grossly intact   Judgement:  Fair  Insight:  Fair  Psychomotor Activity:  Normal- no psychomotor agitation or restlessness   Concentration:  Concentration: Good and Attention Span: Good  Recall:  Good  Fund of Knowledge:  Good  Language:  Good  Akathisia:  Negative  Handed:  Right  AIMS (if indicated):     Assets:  Communication Skills Desire for Improvement Resilience  ADL's:  Intact  Cognition:  WNL  Sleep:       Treatment Plan Summary: Daily contact with patient to assess and evaluate symptoms and progress in treatment, Medication management, Plan inpatient treatment  and medications as below  Observation Level/Precautions:  15 minute checks  Laboratory:   as needed  TSH  Psychotherapy:  Milieu, group therapy   Medications:  We discussed treatment options. As noted she had been started on Prozac but had not started it yet . Will continue Prozac 10 mgrs QDAY  Also add Abilify for antidepressant augmentation and psychotic symptoms Start Abilify 5 mgrs QDAY Ativan 0.5 mgrs Q 6 hours PRN for anxiety Side effects for these medications reviewed .  Consultations:  As needed   Discharge Concerns:    Estimated LOS:  Other:     Physician Treatment Plan for Primary Diagnosis:  MDD, with psychotic symptoms Long Term Goal(s): Improvement in symptoms so as ready for discharge  Short Term Goals: Ability to identify changes in lifestyle to reduce recurrence of condition  will improve, Ability to verbalize feelings will improve, Ability to disclose and discuss suicidal ideas, Ability to demonstrate self-control will improve, Ability to identify and develop effective coping behaviors will improve, Ability to maintain clinical measurements within normal limits will improve and Compliance with prescribed medications will improve  Physician Treatment Plan for Secondary Diagnosis: Active Problems:   MDD (major depressive disorder)  Long Term Goal(s): Improvement in symptoms so as ready for discharge  Short Term Goals: Ability to identify changes in lifestyle to reduce recurrence of condition will improve, Ability to verbalize feelings will improve, Ability to disclose and discuss suicidal ideas, Ability to demonstrate self-control will improve, Ability to identify and develop effective coping behaviors will improve, Ability to maintain clinical measurements within normal limits will improve and Compliance with prescribed medications will improve  I certify that inpatient services furnished can reasonably be expected to improve the patient's condition.    Jenne Campus, MD 5/27/20215:30 PM

## 2019-09-15 NOTE — Progress Notes (Signed)
Talbot Grumbling, NP recommends inpt tx. TTS to seek placement. BH at capacity.  EDP Dr. Florina Ou, MD has been advised.

## 2019-09-15 NOTE — Progress Notes (Signed)
Psychoeducational Group Note  Date:  09/15/2019 Time:  2045 Group Topic/Focus:  wrap up group  Participation Level: Did Not Attend  Participation Quality:  Not Applicable  Affect:  Not Applicable  Cognitive:  Not Applicable  Insight:  Not Applicable  Engagement in Group: Not Applicable  Additional Comments:  Pt was in bed asleep during group time.   Shellia Cleverly 09/15/2019, 9:52 PM

## 2019-09-16 LAB — LIPID PANEL
Cholesterol: 131 mg/dL (ref 0–200)
HDL: 38 mg/dL — ABNORMAL LOW (ref >40)
LDL Cholesterol: 83 mg/dL (ref 0–99)
Total CHOL/HDL Ratio: 3.4 RATIO
Triglycerides: 50 mg/dL (ref <150)
VLDL: 10 mg/dL (ref 0–40)

## 2019-09-16 LAB — TSH: TSH: 1.816 u[IU]/mL (ref 0.350–4.500)

## 2019-09-16 LAB — HEMOGLOBIN A1C
Hgb A1c MFr Bld: 5.2 % (ref 4.8–5.6)
Mean Plasma Glucose: 102.54 mg/dL

## 2019-09-16 NOTE — Progress Notes (Signed)
   09/16/19 2120  COVID-19 Daily Checkoff  Have you had a fever (temp > 37.80C/100F)  in the past 24 hours?  No  If you have had runny nose, nasal congestion, sneezing in the past 24 hours, has it worsened? No  COVID-19 EXPOSURE  Have you traveled outside the state in the past 14 days? No  Have you been in contact with someone with a confirmed diagnosis of COVID-19 or PUI in the past 14 days without wearing appropriate PPE? No  Have you been living in the same home as a person with confirmed diagnosis of COVID-19 or a PUI (household contact)? No  Have you been diagnosed with COVID-19? No

## 2019-09-16 NOTE — Progress Notes (Signed)
   09/16/19 0921  Psych Admission Type (Psych Patients Only)  Admission Status Voluntary  Psychosocial Assessment  Patient Complaints Sleep disturbance;Depression;Anxiety  Eye Contact Fair  Facial Expression Sad;Flat  Affect Appropriate to circumstance  Speech Logical/coherent  Interaction Assertive  Motor Activity Other (Comment) (WNL)  Appearance/Hygiene Unremarkable  Behavior Characteristics Cooperative  Mood Depressed;Pleasant  Thought Process  Coherency WDL  Content WDL  Delusions None reported or observed  Perception WDL  Hallucination None reported or observed  Judgment Impaired  Confusion None  Danger to Self  Current suicidal ideation? Denies  Danger to Others  Danger to Others None reported or observed
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
## 2019-09-16 NOTE — Progress Notes (Signed)
[facility] MD Progress Note  09/16/2019 10:48 AM Erica Neal  MRN:  [id number]  Subjective: Erica Neal reports, "I feel a little better this morning, just tired. Sleep was not the best last night because the staff keep checking on [place] & that kept waking me up each time".  Objective: 39 year old female. She presented to hospital on 5/26 at the recommendation of her OB/Gyn specialist,  reporting depression, feeling hopeless, recent suicidal ideations, states " I have been feeling my family might be better off without me" over the last several days. Two days ago she developed thoughts of shooting self ( reports she does not have ready access to a firearm). She reports she has been experiencing symptoms of anxiety, depression intermittently related to menstrual cycle, with increased symptoms on days preceding her cycle. States " I would start feeling worse like a week before my cycle, I would crash, feel irritable, unable to sleep, tired and anxious".  Erica Neal is seen, chart reviewed. The chart findings discussed with the treatment team. She presents alert, oriented & aware of situation. She is visible on the unit, attending group sessions. She is taking & tolerating her treatment regimen. Denies any side effects. Erica Neal reports that she came to the hospital because of an intense suicidal ideations that lingered then worsened. 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 She says she unknowingly neglected to care for hersel & she almost crashed emotionally. She also reports that since turning 38, she has noticed some drastic changes in her body such as change in her cycle, development of hot flashes etc. She says she has learned her lessons & will be able to put things in perspective going forward after discharge. She adds that sleep was not the best last night because of the frequent checks by staff. She currently denies any SIHI, AVH,  delusional thoughts or paranoia. She does not appear to be responding to any internal stimuli. Erica Neal is in agreement to continue current plan of care as already in progress.  Principal Problem: MDD (major depressive disorder)  Diagnosis: Principal Problem:   MDD (major depressive disorder)  Total Time spent with patient: 25 minutes  Past Psychiatric History: See H&P  Past Medical History:  Past Medical History:  Diagnosis Date  . Anxiety     Past Surgical History:  Procedure Laterality Date  . CERVICAL CERCLAGE    . LIPOMA RESECTION     Family History:  Family History  Problem Relation Age of Onset  . Cancer Father   . Diabetes Maternal Grandmother    Family Psychiatric  History: See H&P  Social History:  Social History   Substance and Sexual Activity  Alcohol Use Yes     Social History   Substance and Sexual Activity  Drug Use Yes   Comment: THC    Social History   Socioeconomic History  . Marital status: Married    Spouse name: Not on file  . Number of children: Not on file  . Years of education: Not on file  . Highest education level: Not on file  Occupational History  . Not on file  Tobacco Use  . Smoking status: Never Smoker  . Smokeless tobacco: Never Used  Substance and Sexual Activity  . Alcohol use: Yes  . Drug use: Yes    Comment: THC  . Sexual activity: Yes    Birth control/protection: None  Other Topics Concern  .  Not on file  Social History Narrative  . Not on file   Social Determinants of Health   Financial Resource Strain:   . Difficulty of Paying Living Expenses:   Food Insecurity:   . Worried About Charity fundraiser in the Last Year:   . Arboriculturist in the Last Year:   Transportation Needs:   . Film/video editor (Medical):   Marland Kitchen Lack of Transportation (Non-Medical):   Physical Activity:   . Days of Exercise per Week:   . Minutes of Exercise per Session:   Stress:   . Feeling of Stress :   Social Connections:    . Frequency of Communication with Friends and Family:   . Frequency of Social Gatherings with Friends and Family:   . Attends Religious Services:   . Active Member of Clubs or Organizations:   . Attends Archivist Meetings:   Marland Kitchen Marital Status:    Additional Social History:   Sleep: Good  Appetite:  Good  Current Medications: Current Facility-Administered Medications  Medication Dose Route Frequency Provider Last Rate Last Admin  . acetaminophen (TYLENOL) tablet 650 mg  650 mg Oral Q6H PRN Merlyn Lot E, NP      . alum & mag hydroxide-simeth (MAALOX/MYLANTA) 200-200-20 MG/5ML suspension 30 mL  30 mL Oral Q4H PRN Merlyn Lot E, NP      . ARIPiprazole (ABILIFY) tablet 5 mg  5 mg Oral Daily Cobos, Myer Peer, MD   5 mg at 09/16/19 0916  . FLUoxetine (PROZAC) capsule 20 mg  20 mg Oral Daily Cobos, Myer Peer, MD   20 mg at 09/16/19 0916  . LORazepam (ATIVAN) tablet 0.5 mg  0.5 mg Oral Q6H PRN Cobos, Fernando A, MD      . magnesium hydroxide (MILK OF MAGNESIA) suspension 30 mL  30 mL Oral Daily PRN Merlyn Lot E, NP      . pantoprazole (PROTONIX) EC tablet 40 mg  40 mg Oral Daily Merlyn Lot E, NP   40 mg at 09/16/19 0916  . traZODone (DESYREL) tablet 50 mg  50 mg Oral QHS PRN Mallie Darting, NP   50 mg at 09/15/19 2135   Lab Results:  Results for orders placed or performed during the hospital encounter of 09/15/19 (from the past 48 hour(s))  TSH     Status: None   Collection Time: 09/16/19  6:40 AM  Result Value Ref Range   TSH 1.816 0.350 - 4.500 uIU/mL    Comment: Performed by a 3rd Generation assay with a functional sensitivity of <=0.01 uIU/mL. Performed at [facility], [place] [address] [place], [place] [address]   Hemoglobin A1c     Status: None   Collection Time: 09/16/19  6:40 AM  Result Value Ref Range   Hgb A1c MFr Bld 5.2 4.8 - 5.6 %    Comment: (NOTE) Pre diabetes:          5.7%-6.4% Diabetes:              >6.4% Glycemic control  for   <7.0% adults with diabetes    Mean Plasma Glucose 102.54 mg/dL    Comment: Performed at [facility] [address] [place], [place] [address]  Lipid panel     Status: Abnormal   Collection Time: 09/16/19  6:40 AM  Result Value Ref Range   Cholesterol 131 0 - 200 mg/dL   Triglycerides 50 <150 mg/dL   HDL 38 (L) >  40 mg/dL   Total CHOL/HDL Ratio 3.4 RATIO   VLDL 10 0 - 40 mg/dL   LDL Cholesterol 83 0 - 99 mg/dL    Comment:        Total Cholesterol/HDL:CHD Risk Coronary Heart Disease Risk Table                     Men   Women  1/2 Average Risk   3.4   3.3  Average Risk       5.0   4.4  2 X Average Risk   9.6   7.1  3 X Average Risk  23.4   11.0        Use the calculated Patient Ratio above and the CHD Risk Table to determine the patient's CHD Risk.        ATP III CLASSIFICATION (LDL):  <100     mg/dL   Optimal  100-129  mg/dL   Near or Above                    Optimal  130-159  mg/dL   Borderline  160-189  mg/dL   High  >190     mg/dL   Very High Performed at [facility] [address] [place], [place] [address]    Blood Alcohol level:  Lab Results  Component Value Date   ETH <10 99991111   Metabolic Disorder Labs: Lab Results  Component Value Date   HGBA1C 5.2 09/16/2019   MPG 102.54 09/16/2019   No results found for: PROLACTIN Lab Results  Component Value Date   CHOL 131 09/16/2019   TRIG 50 09/16/2019   HDL 38 (L) 09/16/2019   CHOLHDL 3.4 09/16/2019   VLDL 10 09/16/2019   LDLCALC 83 09/16/2019   Physical Findings: AIMS:  , ,  ,  ,    CIWA:  CIWA-Ar Total: 8 COWS:     Musculoskeletal: Strength & Muscle Tone: within normal limits Gait & Station: normal Patient leans: N/A  Psychiatric Specialty Exam: Physical Exam  Nursing note and vitals reviewed. Constitutional: She is oriented to person, place, and time. She appears well-developed.  HENT:  Head: Normocephalic.  Eyes: Pupils are equal, round, and reactive  to light.  Respiratory: Effort normal.  Genitourinary:    Genitourinary Comments: Deferred   Musculoskeletal:        General: Normal range of motion.     Cervical back: Normal range of motion.  Neurological: She is alert and oriented to person, place, and time.  Skin: Skin is warm and dry.    Review of Systems  Constitutional: Negative for chills, diaphoresis and fever.  HENT: Negative for congestion, rhinorrhea, sneezing and sore throat.   Eyes: Negative for discharge.  Respiratory: Negative for cough, chest tightness, shortness of breath and wheezing.   Cardiovascular: Negative for chest pain and palpitations.  Gastrointestinal: Negative for diarrhea, nausea and vomiting.  Endocrine: Negative for cold intolerance.  Genitourinary: Negative for difficulty urinating.  Musculoskeletal: Negative for arthralgias.  Skin: Negative.   Allergic/Immunologic: Negative for environmental allergies and food allergies.       Allergies: Azithromycin, Erythromycin, Macrobid  Neurological: Negative for dizziness.  Psychiatric/Behavioral: Positive for dysphoric mood and sleep disturbance. Negative for agitation, behavioral problems, confusion, decreased concentration, hallucinations (Hx. of), self-injury and suicidal ideas. The patient is not nervous/anxious and is not hyperactive.     Blood pressure (!) 120/93, pulse (!) 117, temperature 98.3 F (36.8 C), temperature source Oral,  resp. rate 18, height 5\' 1"  (1.549 m), weight 109.3 kg, SpO2 98 %.Body mass index is 45.54 kg/m.  General Appearance: Well Groomed  Eye Contact:  Good  Speech:  Normal Rate  Volume:  Normal  Mood:  Depressed  Affect:  Constricted and intermittently tearful during session  Thought Process:  Linear and Descriptions of Associations: Intact  Orientation:  Other:  fully alert and attentive  Thought Content:  reports intermittent auditory hallucinations, reports she hears daughter calling her when she is attempting to fall  asleep. Currently does not present internally preoccupied,no delusions are expressed.   Suicidal Thoughts:  No denies current suicidal plan or intention and contracts for safety  Homicidal Thoughts:  No denies HI or violent ideations  Memory:  recent and remote grossly intact   Judgement:  Fair  Insight:  Fair  Psychomotor Activity:  Normal- no psychomotor agitation or restlessness   Concentration:  Concentration: Good and Attention Span: Good  Recall:  Good  Fund of Knowledge:  Good  Language:  Good  Akathisia:  Negative  Handed:  Right  AIMS (if indicated):     Assets:  Communication Skills Desire for Improvement Resilience  ADL's:  Intact  Cognition:  WNL    Sleep:  Number of Hours: 5.75   Treatment Plan Summary: Daily contact with patient to assess and evaluate symptoms and progress in treatment and Medication management.  - Continue inpatient hospitalization. - Will continue today 09/16/2019 plan as below except where it is noted.  Mood control    - Continue Abilify 5 mg po daily. Depression.    - Continue Prozac 20 mg po daily. Anxiety.    - Continue 0.5 mg po Q 6 hrs prn. Jerrye Bushy.    - Continue Protonix 40 mg po Q am. Insomnia.    - Continue Trazodone 50 mg po Q hs prn.  Encourage group participation. Discharge disposition in progress.  Lindell Spar, NP, PMHNP, FNP-BC 09/16/2019, 10:48 AM

## 2019-09-16 NOTE — Tx Team (Signed)
Interdisciplinary Treatment and Diagnostic Plan Update  09/16/2019 Time of Session: 9:00am Erica Neal MRN: [id number]  Principal Diagnosis: <principal problem not specified>  Secondary Diagnoses: Active Problems:   MDD (major depressive disorder)   Current Medications:  Current Facility-Administered Medications  Medication Dose Route Frequency Provider Last Rate Last Admin  . acetaminophen (TYLENOL) tablet 650 mg  650 mg Oral Q6H PRN Merlyn Lot E, NP      . alum & mag hydroxide-simeth (MAALOX/MYLANTA) 200-200-20 MG/5ML suspension 30 mL  30 mL Oral Q4H PRN Merlyn Lot E, NP      . ARIPiprazole (ABILIFY) tablet 5 mg  5 mg Oral Daily Cobos, Myer Peer, MD   5 mg at 09/16/19 0916  . FLUoxetine (PROZAC) capsule 20 mg  20 mg Oral Daily Cobos, Myer Peer, MD   20 mg at 09/16/19 0916  . LORazepam (ATIVAN) tablet 0.5 mg  0.5 mg Oral Q6H PRN Cobos, Fernando A, MD      . magnesium hydroxide (MILK OF MAGNESIA) suspension 30 mL  30 mL Oral Daily PRN Merlyn Lot E, NP      . pantoprazole (PROTONIX) EC tablet 40 mg  40 mg Oral Daily Merlyn Lot E, NP   40 mg at 09/16/19 0916  . traZODone (DESYREL) tablet 50 mg  50 mg Oral QHS PRN Mallie Darting, NP   50 mg at 09/15/19 2135   PTA Medications: Medications Prior to Admission  Medication Sig Dispense Refill Last Dose  . albuterol (PROVENTIL HFA;VENTOLIN HFA) 108 (90 Base) MCG/ACT inhaler Inhale 1-2 puffs into the lungs every 6 (six) hours as needed for wheezing or shortness of breath. 1 Inhaler 0   . clonazePAM (KLONOPIN) 1 MG tablet Take 1 mg by mouth daily.     Marland Kitchen dimenhyDRINATE (DRAMAMINE) 50 MG tablet Take 25 mg by mouth every 8 (eight) hours as needed for nausea or dizziness.     Marland Kitchen FLUoxetine (PROZAC) 20 MG tablet Take 20 mg by mouth daily.      Marland Kitchen HYDROcodone-homatropine (HYCODAN) 5-1.5 MG/5ML syrup Take 5 mLs by mouth at bedtime as needed for cough. 60 mL 0   . hydrOXYzine (ATARAX/VISTARIL) 25 MG tablet Take 75 mg by mouth daily  as needed for anxiety.     Marland Kitchen LORazepam (ATIVAN) 0.5 MG tablet Take 0.5 mg by mouth 3 (three) times daily as needed for anxiety or sleep.      . norethindrone (AYGESTIN) 5 MG tablet Take 10 mg by mouth daily.     . pantoprazole (PROTONIX) 40 MG tablet Take 40 mg by mouth daily.     . QUEtiapine (SEROQUEL) 100 MG tablet Take 100-200 mg by mouth at bedtime.     . sertraline (ZOLOFT) 100 MG tablet Take 100 mg by mouth daily.      . traZODone (DESYREL) 50 MG tablet Take 50-100 mg by mouth at bedtime as needed for sleep.      Marland Kitchen venlafaxine XR (EFFEXOR-XR) 37.5 MG 24 hr capsule Take 37.5 mg by mouth daily.     Marland Kitchen zolpidem (AMBIEN) 5 MG tablet Take 5 mg by mouth daily.       Patient Stressors: Financial difficulties Health problems Loss of father 4 years ago  Patient Strengths: Active sense of humor Capable of independent living Motivation for treatment/growth Supportive family/friends  Treatment Modalities: Medication Management, Group therapy, Case management,  1 to 1 session with clinician, Psychoeducation, Recreational therapy.   Physician Treatment Plan for Primary Diagnosis: <principal problem not specified> Long  Term Goal(s): Improvement in symptoms so as ready for discharge Improvement in symptoms so as ready for discharge   Short Term Goals: Ability to identify changes in lifestyle to reduce recurrence of condition will improve Ability to verbalize feelings will improve Ability to disclose and discuss suicidal ideas Ability to demonstrate self-control will improve Ability to identify and develop effective coping behaviors will improve Ability to maintain clinical measurements within normal limits will improve Compliance with prescribed medications will improve Ability to identify changes in lifestyle to reduce recurrence of condition will improve Ability to verbalize feelings will improve Ability to disclose and discuss suicidal ideas Ability to demonstrate self-control will  improve Ability to identify and develop effective coping behaviors will improve Ability to maintain clinical measurements within normal limits will improve Compliance with prescribed medications will improve  Medication Management: Evaluate patient's response, side effects, and tolerance of medication regimen.  Therapeutic Interventions: 1 to 1 sessions, Unit Group sessions and Medication administration.  Evaluation of Outcomes: Progressing  Physician Treatment Plan for Secondary Diagnosis: Active Problems:   MDD (major depressive disorder)  Long Term Goal(s): Improvement in symptoms so as ready for discharge Improvement in symptoms so as ready for discharge   Short Term Goals: Ability to identify changes in lifestyle to reduce recurrence of condition will improve Ability to verbalize feelings will improve Ability to disclose and discuss suicidal ideas Ability to demonstrate self-control will improve Ability to identify and develop effective coping behaviors will improve Ability to maintain clinical measurements within normal limits will improve Compliance with prescribed medications will improve Ability to identify changes in lifestyle to reduce recurrence of condition will improve Ability to verbalize feelings will improve Ability to disclose and discuss suicidal ideas Ability to demonstrate self-control will improve Ability to identify and develop effective coping behaviors will improve Ability to maintain clinical measurements within normal limits will improve Compliance with prescribed medications will improve     Medication Management: Evaluate patient's response, side effects, and tolerance of medication regimen.  Therapeutic Interventions: 1 to 1 sessions, Unit Group sessions and Medication administration.  Evaluation of Outcomes: Progressing   RN Treatment Plan for Primary Diagnosis: <principal problem not specified> Long Term Goal(s): Knowledge of disease and  therapeutic regimen to maintain health will improve  Short Term Goals: Ability to verbalize feelings will improve and Ability to identify and develop effective coping behaviors will improve  Medication Management: RN will administer medications as ordered by provider, will assess and evaluate patient's response and provide education to patient for prescribed medication. RN will report any adverse and/or side effects to prescribing provider.  Therapeutic Interventions: 1 on 1 counseling sessions, Psychoeducation, Medication administration, Evaluate responses to treatment, Monitor vital signs and CBGs as ordered, Perform/monitor CIWA, COWS, AIMS and Fall Risk screenings as ordered, Perform wound care treatments as ordered.  Evaluation of Outcomes: Progressing   LCSW Treatment Plan for Primary Diagnosis: <principal problem not specified> Long Term Goal(s): Safe transition to appropriate next level of care at discharge, Engage patient in therapeutic group addressing interpersonal concerns.  Short Term Goals: Engage patient in aftercare planning with referrals and resources, Increase social support, Increase emotional regulation, Identify triggers associated with mental health/substance abuse issues and Increase skills for wellness and recovery  Therapeutic Interventions: Assess for all discharge needs, 1 to 1 time with Social worker, Explore available resources and support systems, Assess for adequacy in community support network, Educate family and significant other(s) on suicide prevention, Complete Psychosocial Assessment, Interpersonal group therapy.  Evaluation of  Outcomes: Progressing  Progress in Treatment: Attending groups: Yes. Participating in groups: Yes. Taking medication as prescribed: Yes. Toleration medication: Yes. Family/Significant other contact made: No, will contact:  supports if consents are granted. Patient understands diagnosis: Yes. Discussing patient identified  problems/goals with staff: Yes. Medical problems stabilized or resolved: Yes. Denies suicidal/homicidal ideation: Yes. Issues/concerns per patient self-inventory: Yes.  New problem(s) identified: Yes, Describe:  PMS symptoms  New Short Term/Long Term Goal(s): medication management for mood stabilization; elimination of SI thoughts; development of comprehensive mental wellness/sobriety plan.  Patient Goals: "Coping skills."  Discharge Plan or Barriers: Has a therapist appt with [facility] on 06/09. Would like to be referred to a psychiatrist.  Reason for Continuation of Hospitalization: Anxiety Depression Medication stabilization  Estimated Length of Stay: 3-5 days  Attendees: Patient: Erica Neal 09/16/2019 9:42 AM  Physician:  09/16/2019 9:42 AM  Nursing:  09/16/2019 9:42 AM  RN Care Manager: 09/16/2019 9:42 AM  Social Worker: Stephanie Acre, [place] 09/16/2019 9:42 AM  Recreational Therapist:  09/16/2019 9:42 AM  Other: Marvia Pickles, NP 09/16/2019 9:42 AM  Other:  09/16/2019 9:42 AM  Other: 09/16/2019 9:42 AM    Scribe for Treatment Team: Joellen Jersey, [place] 09/16/2019 9:42 AM

## 2019-09-16 NOTE — [facility] Group Notes (Signed)
09/16/2019 8:45am Type of Group and Topic: Psychoeducational Group: Discharge Planning   Participation Level: Did Not Attend  Description of Group  Discharge planning group reviews patient's anticipated discharge plans and assists patients to anticipate and address any barriers to wellness/recovery in the community. Suicide prevention education is reviewed with patients in group. Therapeutic Goals 1. Patients will state their anticipated discharge plan and mental health aftercare 2. Patients will identify potential barriers to wellness in the community setting 3. Patients will engage in problem solving, solution focused discussion of ways to anticipate and address barriers to wellness/recovery   Summary of Patient Progress Plan for Discharge/Comments:  Invited, chose not attend.     Radonna Ricker, MSW, [place] Worker [facility]  Phone: [phone] 09/16/2019 2:06 PM

## 2019-09-16 NOTE — Progress Notes (Signed)
Recreation Therapy Notes  Date:  5.28.21 Time: 0930 Location: 300 Hall Group Room  Group Topic: Stress Management  Goal Area(s) Addresses:  Patient will identify positive stress management techniques. Patient will identify benefits of using stress management post d/c.  Behavioral Response:  Engaged  Intervention: Stress Management  Activity: Guided Imagery.  LRT read a script that lead patients on a journey being outside at night observing the stars in the sky.  Patients were to listen and follow along as script was read to engage in activity.   Education:  Stress Management, Discharge Planning.   Education Outcome: Acknowledges Education  Clinical Observations/Feedback: Pt attended and participated in activity.     Victorino Sparrow, LRT/CTRS         Victorino Sparrow A 09/16/2019 12:05 PM

## 2019-09-16 NOTE — Progress Notes (Signed)
Patient has been up in the dayroom watching tv and interacting appropriately with peers. She attended group. Patient is -si/hi/a/v hall. Support and encouragement offered, safety maintained on unit, will continue to monitor.

## 2019-09-16 NOTE — Progress Notes (Addendum)
Nutrition Brief Note RD working remotely.   Patient identified on the Malnutrition Screening Tool (MST) Report  Wt Readings from Last 15 Encounters:  09/15/19 109.3 kg  09/14/19 109.3 kg    Body mass index is 45.54 kg/m. Patient meets criteria for morbid obesity based on current BMI.   Patient had reported to [facility] staff that she had lost weight and since re-gained it.   Current diet order is Regular and patient is eating as desired for meals and snacks at this time. Labs and medications reviewed.   No nutrition interventions warranted at this time. If nutrition issues arise, please consult RD.      Jarome Matin, MS, RD, LDN, CNSC Inpatient Clinical Dietitian RD pager # available in [place]  After hours/weekend pager # available in [facility]

## 2019-09-17 MED ORDER — NORETHINDRONE ACETATE 5 MG PO TABS
10.0000 mg | ORAL_TABLET | Freq: Every day | ORAL | Status: DC
  Administered 2019-09-17 – 2019-09-18 (×2): 10 mg via ORAL

## 2019-09-17 NOTE — Progress Notes (Signed)
   09/17/19 2049  Psych Admission Type (Psych Patients Only)  Admission Status Voluntary  Psychosocial Assessment  Patient Complaints None  Eye Contact Fair  Facial Expression Animated  Affect Appropriate to circumstance  Speech Logical/coherent  Interaction Assertive  Motor Activity Other (Comment) (WNL)  Appearance/Hygiene Unremarkable  Behavior Characteristics Appropriate to situation;Cooperative;Calm  Mood Pleasant;Euthymic  Thought Process  Coherency WDL  Content WDL  Delusions None reported or observed  Perception WDL  Hallucination None reported or observed  Judgment WDL  Confusion None  Danger to Self  Current suicidal ideation? Denies  Danger to Others  Danger to Others None reported or observed  Pt A&O, excited about pending DC tomorrow. Reports no HA and 2 groups today as good things that happened. Monitoring for safety continues.

## 2019-09-17 NOTE — [facility] Suicide Risk Assessment (Signed)
[place] INPATIENT:  Family/Significant Other Suicide Prevention Education  Suicide Prevention Education: Education Completed; Wife, Erica Neal [phone]), has been identified by the patient as the family member/significant other with whom the patient will be residing, and identified as the person(s) who will aid the patient in the event of a mental health crisis (suicidal ideations/suicide attempt).  With written consent from the patient, the family member/significant other has been provided the following suicide prevention education, prior to the and/or following the discharge of the patient.  The suicide prevention education provided includes the following:  Suicide risk factors  Suicide prevention and interventions  National Suicide Hotline telephone number  [facility] assessment telephone number  [facility] Emergency Assistance [place] and/or Residential Mobile Crisis Unit telephone number   Request made of family/significant other to:  Remove weapons (e.g., guns, rifles, knives), all items previously/currently identified as safety concern.    Remove drugs/medications (over-the-counter, prescriptions, illicit drugs), all items previously/currently identified as a safety concern.   The family member/significant other verbalizes understanding of the suicide prevention education information provided.  The family member/significant other agrees to remove the items of safety concern listed above.  CSW spoke with patients wife, Erica Neal who stated that this patient has been experiencing ongoing issues with her hormones during her menstrual cycle. She reported that this patient has not been sleeping well and has been tired and drained lately and has also been getting more frustrated. Patient wife reported having no safety concerns for this patient, but reported she wants to ensure patient is able to increase her coping skills and tools. Patients wife did  report having a gun in the house, but stated that she has the weapon secured.    [name] MSW, [place] Worker  [facility]

## 2019-09-17 NOTE — Progress Notes (Signed)
[facility] MD Progress Note  09/17/2019 9:14 AM Erica Neal  MRN:  [id number]  Subjective: Patient reports she is feeling better than on admission. Reports improving mood and denies suicidal ideations at this time. She also states hallucinations have resolved  Objective: I have reviewed chart notes and have met with patient. 39 year old female, presented on 5/26 reporting worsening depression, feelings of hopelessness, recent onset SI, neurovegetative symptoms of depression.  She also reports episodic/brief auditory and visual hallucinations which she describes as hearing her daughter call her name and seeing fleeting shadows.  She reports a long history of having significant mood symptoms on days preceding her menses, and states her OB/GYN specialist initiated hormonal therapy several weeks ago in spite of which psychiatric symptoms have persisted.  Stressors include 92-year-old daughter having chronic GI symptoms/issues and currently being on leave from work.  Patient reports she is feeling better and describes improving mood. She presents with a fuller range of affect today. Denies hallucinations at this time and does not appear internally preoccupied . Behavior on unit calm and in good control- has been going to groups and has been visible in day room, interactive with peers . She is currently on Prozac and Abilify- denies side effects. Las reviewed- HgbA1C 5.2, TSH 1.81, Lipid panel unremarkable .   Principal Problem: MDD (major depressive disorder)  Diagnosis: Principal Problem:   MDD (major depressive disorder)  Total Time spent with patient: 15 minutes  Past Psychiatric History: See H&P  Past Medical History:  Past Medical History:  Diagnosis Date  . Anxiety     Past Surgical History:  Procedure Laterality Date  . CERVICAL CERCLAGE    . LIPOMA RESECTION     Family History:  Family History  Problem Relation Age of Onset  . Cancer Father   . Diabetes Maternal Grandmother     Family Psychiatric  History: See H&P  Social History:  Social History   Substance and Sexual Activity  Alcohol Use Yes     Social History   Substance and Sexual Activity  Drug Use Yes   Comment: THC    Social History   Socioeconomic History  . Marital status: Married    Spouse name: Not on file  . Number of children: Not on file  . Years of education: Not on file  . Highest education level: Not on file  Occupational History  . Not on file  Tobacco Use  . Smoking status: Never Smoker  . Smokeless tobacco: Never Used  Substance and Sexual Activity  . Alcohol use: Yes  . Drug use: Yes    Comment: THC  . Sexual activity: Yes    Birth control/protection: None  Other Topics Concern  . Not on file  Social History Narrative  . Not on file   Social Determinants of Health   Financial Resource Strain:   . Difficulty of Paying Living Expenses:   Food Insecurity:   . Worried About Charity fundraiser in the Last Year:   . Arboriculturist in the Last Year:   Transportation Needs:   . Film/video editor (Medical):   Marland Kitchen Lack of Transportation (Non-Medical):   Physical Activity:   . Days of Exercise per Week:   . Minutes of Exercise per Session:   Stress:   . Feeling of Stress :   Social Connections:   . Frequency of Communication with Friends and Family:   . Frequency of Social Gatherings with Friends and Family:   .  Attends Religious Services:   . Active Member of Clubs or Organizations:   . Attends Archivist Meetings:   Marland Kitchen Marital Status:    Additional Social History:   Sleep: improving   Appetite:  Good  Current Medications: Current Facility-Administered Medications  Medication Dose Route Frequency Provider Last Rate Last Admin  . acetaminophen (TYLENOL) tablet 650 mg  650 mg Oral Q6H PRN Mallie Darting, NP   650 mg at 09/16/19 2118  . alum & mag hydroxide-simeth (MAALOX/MYLANTA) 200-200-20 MG/5ML suspension 30 mL  30 mL Oral Q4H PRN Merlyn Lot E, NP      . ARIPiprazole (ABILIFY) tablet 5 mg  5 mg Oral Daily Rashay Barnette, Myer Peer, MD   5 mg at 09/17/19 0909  . FLUoxetine (PROZAC) capsule 20 mg  20 mg Oral Daily David Rodriquez, Myer Peer, MD   20 mg at 09/17/19 0909  . LORazepam (ATIVAN) tablet 0.5 mg  0.5 mg Oral Q6H PRN Jontavia Leatherbury, Myer Peer, MD   0.5 mg at 09/16/19 2118  . magnesium hydroxide (MILK OF MAGNESIA) suspension 30 mL  30 mL Oral Daily PRN Merlyn Lot E, NP      . pantoprazole (PROTONIX) EC tablet 40 mg  40 mg Oral Daily Merlyn Lot E, NP   40 mg at 09/17/19 0909  . traZODone (DESYREL) tablet 50 mg  50 mg Oral QHS PRN Mallie Darting, NP   50 mg at 09/16/19 2118   Lab Results:  Results for orders placed or performed during the hospital encounter of 09/15/19 (from the past 48 hour(s))  TSH     Status: None   Collection Time: 09/16/19  6:40 AM  Result Value Ref Range   TSH 1.816 0.350 - 4.500 uIU/mL    Comment: Performed by a 3rd Generation assay with a functional sensitivity of <=0.01 uIU/mL. Performed at [facility], [place] [address] [place], [place] [address]   Hemoglobin A1c     Status: None   Collection Time: 09/16/19  6:40 AM  Result Value Ref Range   Hgb A1c MFr Bld 5.2 4.8 - 5.6 %    Comment: (NOTE) Pre diabetes:          5.7%-6.4% Diabetes:              >6.4% Glycemic control for   <7.0% adults with diabetes    Mean Plasma Glucose 102.54 mg/dL    Comment: Performed at [facility] [address], [place], [place] [address]  Lipid panel     Status: Abnormal   Collection Time: 09/16/19  6:40 AM  Result Value Ref Range   Cholesterol 131 0 - 200 mg/dL   Triglycerides 50 <150 mg/dL   HDL 38 (L) >40 mg/dL   Total CHOL/HDL Ratio 3.4 RATIO   VLDL 10 0 - 40 mg/dL   LDL Cholesterol 83 0 - 99 mg/dL    Comment:        Total Cholesterol/HDL:CHD Risk Coronary Heart Disease Risk Table                     Men   Women  1/2 Average Risk   3.4   3.3  Average Risk       5.0   4.4  2 X Average  Risk   9.6   7.1  3 X Average Risk  23.4   11.0        Use the calculated Patient Ratio above and the  CHD Risk Table to determine the patient's CHD Risk.        ATP III CLASSIFICATION (LDL):  <100     mg/dL   Optimal  100-129  mg/dL   Near or Above                    Optimal  130-159  mg/dL   Borderline  160-189  mg/dL   High  >190     mg/dL   Very High Performed at [facility] [address] [place], [place] [address]    Blood Alcohol level:  Lab Results  Component Value Date   ETH <10 38/32/9191   Metabolic Disorder Labs: Lab Results  Component Value Date   HGBA1C 5.2 09/16/2019   MPG 102.54 09/16/2019   No results found for: PROLACTIN Lab Results  Component Value Date   CHOL 131 09/16/2019   TRIG 50 09/16/2019   HDL 38 (L) 09/16/2019   CHOLHDL 3.4 09/16/2019   VLDL 10 09/16/2019   LDLCALC 83 09/16/2019   Physical Findings: AIMS:  , ,  ,  ,    CIWA:  CIWA-Ar Total: 8 COWS:     Musculoskeletal: Strength & Muscle Tone: within normal limits Gait & Station: normal Patient leans: N/A  Psychiatric Specialty Exam: Physical Exam  Nursing note and vitals reviewed. Constitutional: She is oriented to person, place, and time. She appears well-developed.  HENT:  Head: Normocephalic.  Left Ear: External ear normal.  Eyes: Pupils are equal, round, and reactive to light.  Respiratory: Effort normal.  Genitourinary:    Genitourinary Comments: Deferred   Musculoskeletal:        General: Normal range of motion.     Cervical back: Normal range of motion.  Neurological: She is alert and oriented to person, place, and time.  Skin: Skin is warm and dry.    Review of Systems  Constitutional: Negative for chills, diaphoresis and fever.  HENT: Negative for congestion, rhinorrhea, sneezing and sore throat.   Eyes: Negative for discharge.  Respiratory: Negative for cough, chest tightness, shortness of breath and wheezing.   Cardiovascular: Negative  for chest pain and palpitations.  Gastrointestinal: Negative for diarrhea, nausea and vomiting.  Endocrine: Negative for cold intolerance.  Genitourinary: Negative for difficulty urinating.  Musculoskeletal: Negative for arthralgias.  Skin: Negative.   Allergic/Immunologic: Negative for environmental allergies and food allergies.       Allergies: Azithromycin, Erythromycin, Macrobid  Neurological: Negative for dizziness.  Psychiatric/Behavioral: Positive for dysphoric mood and sleep disturbance. Negative for agitation, behavioral problems, confusion, decreased concentration, hallucinations (Hx. of), self-injury and suicidal ideas. The patient is not nervous/anxious and is not hyperactive.   denies chest pain or shortness of breath, no vomiting   Blood pressure 104/80, pulse (!) 120, temperature 99 F (37.2 C), temperature source Oral, resp. rate 18, height '5\' 1"'$  (1.549 m), weight 109.3 kg, SpO2 98 %.Body mass index is 45.54 kg/m.  General Appearance: Well Groomed  Eye Contact:  Good  Speech:  Normal Rate  Volume:  Normal  Mood:  reports improving mood  Affect:  improving , fuller in range, not tearful at this time  Thought Process:  Linear and Descriptions of Associations: Intact  Orientation:  Other:  fully alert and attentive  Thought Content:  denies hallucinations at this time and does not appear internally preoccupied, no delusions are expressed    Suicidal Thoughts:  No denies current suicidal plan or intention and contracts for safety  Homicidal  Thoughts:  No denies HI or violent ideations  Memory:  recent and remote grossly intact   Judgement:  improving   Insight:  Fair/ improving   Psychomotor Activity:  Normal- no psychomotor agitation or restlessness   Concentration:  Concentration: Good and Attention Span: Good  Recall:  Good  Fund of Knowledge:  Good  Language:  Good  Akathisia:  Negative  Handed:  Right  AIMS (if indicated):     Assets:  Communication Skills Desire  for Improvement Resilience  ADL's:  Intact  Cognition:  WNL    Sleep:  Number of Hours: 6.5   Assessment - 39 year old female, presented on 5/26 reporting worsening depression, feelings of hopelessness, recent onset SI, neurovegetative symptoms of depression.  She also reports episodic/brief auditory and visual hallucinations which she describes as hearing her daughter call her name and seeing fleeting shadows.  She reports a long history of having significant mood symptoms on days preceding her menses, and states her OB/GYN specialist initiated hormonal therapy several weeks ago in spite of which psychiatric symptoms have persisted.  Stressors include 29-year-old daughter having chronic GI symptoms/issues and currently being on leave from work.  Patient is reporting improving mood and presents with improved mood and a fuller range of affect . Denies SI, and presents future oriented at this time. She is tolerating medications ( Abilify, Prozac) well thus far. Side effects reviewed , to include risk of akathisia, metabolic disturbances, motor side effects on Abilify.   Treatment Plan Summary: Daily contact with patient to assess and evaluate symptoms and progress in treatment and Medication management. Treatment Plan reviewed as below 5/29 Encourage group and milieu participation Treatment team working on disposition planning options Continue Abilify 5 mg po daily for mood disorder, psychosis Continue Prozac 20 mg po daily for depression Continue Ativan 0.5 mg po Q 6 hrs prn for anxiety Continue Protonix 40 mg po Q am. For GERD symptoms Continue Trazodone 50 mg po Q hs prn.for insomnia as needed    Jenne Campus, MD 09/17/2019, 9:14 AM   Patient ID: Glori Bickers, female   DOB: [birth year], 39 y.o.   MRN: [id number]

## 2019-09-17 NOTE — [facility] Group Notes (Signed)
Adult Psychoeducational Group Note  Date:  09/17/2019 Time:  3:14 PM  Group Topic/Focus:  Goals Group:   The focus of this group is to help patients establish daily goals to achieve during treatment and discuss how the patient can incorporate goal setting into their daily lives to aide in recovery.  Participation Level:  Active  Participation Quality:  Attentive  Affect:  Appropriate  Cognitive:  Appropriate  Insight: Improving  Engagement in Group:  Engaged  Modes of Intervention:  Discussion and Socialization  Additional Comments:  Came in the group late, but she did participate after coming  Paulino Rily 09/17/2019, 3:14 PM

## 2019-09-17 NOTE — Plan of Care (Signed)
Pleasant and cooperative and active in the milieu. Alert and oriented and denying thoughts of self harm. Denying hallucinations. Patient reports improvement in mood and denies thoughts of self harm. Denies hallucinations. Attending groups and taking medications as prescribed. Had no concerns throughout this shift.

## 2019-09-18 MED ORDER — FLUOXETINE HCL 20 MG PO CAPS: 20.0000 mg | ORAL_CAPSULE | Freq: Every day | ORAL | 0 refills | Status: DC

## 2019-09-18 MED ORDER — TRAZODONE HCL 50 MG PO TABS: 50.0000 mg | ORAL_TABLET | Freq: Every evening | ORAL | 0 refills | Status: DC | PRN

## 2019-09-18 MED ORDER — ARIPIPRAZOLE 5 MG PO TABS: 5.0000 mg | ORAL_TABLET | Freq: Every day | ORAL | 0 refills | Status: DC

## 2019-09-18 NOTE — Progress Notes (Addendum)
   09/18/19 0900  Psych Admission Type (Psych Patients Only)  Admission Status Voluntary  Psychosocial Assessment  Patient Complaints Anxiety  Eye Contact Fair  Facial Expression Animated  Affect Appropriate to circumstance  Speech Logical/coherent  Interaction Assertive  Motor Activity Other (Comment) (WNL)  Appearance/Hygiene Unremarkable  Behavior Characteristics Cooperative;Appropriate to situation  Mood Pleasant  Thought Process  Coherency WDL  Content WDL  Delusions None reported or observed  Perception WDL  Hallucination None reported or observed  Judgment WDL  Confusion None  Danger to Self  Current suicidal ideation? Denies  Danger to Others  Danger to Others None reported or observed     09/18/19 0900  Psych Admission Type (Psych Patients Only)  Admission Status Voluntary  Psychosocial Assessment  Patient Complaints Anxiety  Eye Contact Fair  Facial Expression Animated  Affect Appropriate to circumstance  Speech Logical/coherent  Interaction Assertive  Motor Activity Other (Comment) (WNL)  Appearance/Hygiene Unremarkable  Behavior Characteristics Cooperative;Appropriate to situation  Mood Pleasant  Thought Process  Coherency WDL  Content WDL  Delusions None reported or observed  Perception WDL  Hallucination None reported or observed  Judgment WDL  Confusion None  Danger to Self  Current suicidal ideation? Denies  Danger to Others  Danger to Others None reported or observed    Pt observed in the milieu attending groups, and interacting well with peers and staff- Per pt's self inventory, pt rated her depression, hopelessness and anxiety a 0/0/2, respectively. Pt writes that her most important goal to work on today is "controlling anxiety ", and that she will accomplish this by "deep breaths and meditating". Pt currently denies SI/HI and A/VH. Pt remains safe on the unit with Q 15 min checks

## 2019-09-18 NOTE — [facility] Suicide Risk Assessment (Signed)
[facility] Discharge Suicide Risk Assessment   Principal Problem: MDD (major depressive disorder) Discharge Diagnoses: Principal Problem:   MDD (major depressive disorder)   Total Time spent with patient: 30 minutes  Musculoskeletal: Strength & Muscle Tone: within normal limits Gait & Station: normal Patient leans: N/A  Psychiatric Specialty Exam: Review of Systems denies headache, no chest pain, no shortness of breath, no nausea, no vomiting  Blood pressure 129/70, pulse (!) 122, temperature 98.6 F (37 C), temperature source Oral, resp. rate 18, height 5\' 1"  (1.549 m), weight 109.3 kg, SpO2 98 %.Body mass index is 45.54 kg/m.  General Appearance: Well Groomed  Eye Contact::  Good  Speech:  Normal Rate409  Volume:  Normal  Mood:  Improved, states "I feel a lot better" affect appropriate and full in range  Affect:  As above  Thought Process:  Linear and Descriptions of Associations: Intact  Orientation:  Full (Time, Place, and Person)  Thought Content:  Denies hallucinations and states that she has no longer been hearing her daughter calling when she is trying to fall asleep which is allowed her to sleep better.  Currently not internally preoccupied.  No delusions are expressed.  Suicidal Thoughts:  No she denies suicidal or self-injurious ideations  Homicidal Thoughts:  No no homicidal or violent ideations  Memory:  Recent and remote grossly intact  Judgement:  Other:  Improving  Insight:  Improving  Psychomotor Activity:  Normal no psychomotor agitation or restlessness  Concentration:  Good  Recall:  Good  Fund of Knowledge:Good  Language: Good  Akathisia:  Negative  Handed:  Right  AIMS (if indicated):     Assets:  Communication Skills Desire for Improvement Resilience  Sleep:  Number of Hours: 6.75  Cognition: WNL  ADL's:  Intact   Mental Status Per Nursing Assessment::   On Admission:  Suicidal ideation indicated by patient  Demographic Factors:  39 year old female,  married, has 2 children, employed (currently taking time off work)  Loss Factors: Daughter has chronic GI symptoms.  Historical Factors: No prior psychiatric admissions, no prior suicide attempts  Risk Reduction Factors:   Responsible for children under 50 years of age, Sense of responsibility to family, Living with another person, especially a relative, Positive social support and Positive coping skills or problem solving skills  Continued Clinical Symptoms:  Today patient presents alert, attentive, calm, pleasant on approach, reports feeling "a lot better".  Affect is appropriate and full in range.  No thought disorders noted.  She denies current hallucinations and does not appear internally preoccupied.  No delusions are expressed.  She is future oriented and looking forward to reunite with her spouse and children. No disruptive or agitated behaviors on unit. She is tolerating medications well and denies side effects.  We have reviewed side effect profile to include potential risk of akathisia, metabolic disturbances, sedation and possible risk of TD and NMS associated with Abilify. With her expressed consent I have spoken with patient's spouse Magda Paganini), who corroborates that patient seems significantly improved and was in agreement with discharge today.  Cognitive Features That Contribute To Risk:  No gross cognitive deficits noted upon discharge. Is alert , attentive, and oriented x 3   Suicide Risk:  Mild:  Suicidal ideation of limited frequency, intensity, duration, and specificity.  There are no identifiable plans, no associated intent, mild dysphoria and related symptoms, good self-control (both objective and subjective assessment), few other risk factors, and identifiable protective factors, including available and accessible social support.  Plan Of Care/Follow-up recommendations:  Activity:  As tolerated Diet:  Regular Tests:  NA Other:  See below  Patient is expressing  readiness for discharge, reports feeling significantly better than she did on admission.  She is leaving unit in good spirits.  There are no current grounds for involuntary commitment.  Plans to return home.  Plans to follow-up for outpatient psychiatric services and also plans to continue seeing her PCP (Dr. Theda Sers) and her OB/GYN for ongoing outpatient medical treatment.  Jenne Campus, MD 09/18/2019, 10:55 AM

## 2019-09-18 NOTE — Discharge Summary (Signed)
Physician Discharge Summary Note  Patient:  Erica Neal is an 39 y.o., female MRN:  [id number] DOB:  12/02/1980 Patient phone:  [phone] (home)  Patient address:   [address] [place] [address],  Total Time spent with patient: 15 minutes  Date of Admission:  09/15/2019 Date of Discharge: 09/18/2019  Reason for Admission:  suicidal ideation  Principal Problem: MDD (major depressive disorder) Discharge Diagnoses: Principal Problem:   MDD (major depressive disorder)   Past Psychiatric History: no prior psychiatric admissions, denies past history of suicide attempts or of self injurious behaviors. She denies prior episodes of severe depression, denies history of postpartum depression. Denies history of mania or hypomania. Denies history of psychosis. Does not endorse PTSD history. Denies prior history of panic or agoraphobia.   Past Medical History:  Past Medical History:  Diagnosis Date  . Anxiety     Past Surgical History:  Procedure Laterality Date  . CERVICAL CERCLAGE    . LIPOMA RESECTION     Family History:  Family History  Problem Relation Age of Onset  . Cancer Father   . Diabetes Maternal Grandmother    Family Psychiatric  History: no family history of mental illness . No suicides in family Social History:  Social History   Substance and Sexual Activity  Alcohol Use Yes     Social History   Substance and Sexual Activity  Drug Use Yes   Comment: THC    Social History   Socioeconomic History  . Marital status: Married    Spouse name: Not on file  . Number of children: Not on file  . Years of education: Not on file  . Highest education level: Not on file  Occupational History  . Not on file  Tobacco Use  . Smoking status: Never Smoker  . Smokeless tobacco: Never Used  Substance and Sexual Activity  . Alcohol use: Yes  . Drug use: Yes    Comment: THC  . Sexual activity: Yes    Birth control/protection: None  Other Topics  Concern  . Not on file  Social History Narrative  . Not on file   Social Determinants of Health   Financial Resource Strain:   . Difficulty of Paying Living Expenses:   Food Insecurity:   . Worried About Charity fundraiser in the Last Year:   . Arboriculturist in the Last Year:   Transportation Needs:   . Film/video editor (Medical):   Marland Kitchen Lack of Transportation (Non-Medical):   Physical Activity:   . Days of Exercise per Week:   . Minutes of Exercise per Session:   Stress:   . Feeling of Stress :   Social Connections:   . Frequency of Communication with Friends and Family:   . Frequency of Social Gatherings with Friends and Family:   . Attends Religious Services:   . Active Member of Clubs or Organizations:   . Attends Archivist Meetings:   Marland Kitchen Marital Status:     Hospital Course:  From admission H&P: 39 year old female. She presented to hospital on 5/26 at the recommendation of her OB/Gyn specialist,  reporting depression, feeling hopeless, recent suicidal ideations, states " I have been feeling my family might be better off without me" over the last several days. Two days ago she developed thoughts of shooting self ( reports she does not have ready access to a firearm). She reports she has been experiencing symptoms of anxiety, depression  intermittently related to menstrual cycle, with increased symptoms on days preceding her cycle. States " I would start feeling worse like a week before my cycle , I would crash, feel irritable, unable to sleep, tired and anxious".  States she has had these symptoms for several years but feels they have been getting worse. She reports she has been in treatment with her OBGyn MD and started hormonal therapy 2 months ago. States that although this caused amenorrhea, psychiatric symptoms have persisted. Endorses neuro-vegetative symptoms as below. Also reports vague auditory ( mainly when dozing off/trying to sleep) and fleeting visual  hallucinations ( shadows) over the last two weeks or so. In addition to depression she also reports increased anxiety , feeling panicky at times, and often nauseous. Endorses some contributing stressors- she is currently taking time off work. Her 64 y old daughter has chronic digestive issues/constipation for which she requires frequent care/ medical visits.  Ms. Erica Neal was admitted for depression with suicidal ideation. She remained on the [facility] unit for three days. She was started on Prozac and Abilify. Trazodone was continued. She participated in group therapy on the unit. She responded well to treatment with no adverse effects reported. She has shown improved mood, affect, sleep, and interaction. She denies any SI/HI/AVH and contracts for safety. She is discharging on the medications listed below. She agrees to follow up at the [facility] (see below). Patient is provided with prescriptions for medications upon discharge. Her family is picking her up for discharge home.  Physical Findings: AIMS:  , ,  ,  ,    CIWA:  CIWA-Ar Total: 8 COWS:     Musculoskeletal: Strength & Muscle Tone: within normal limits Gait & Station: normal Patient leans: N/A  Psychiatric Specialty Exam: Physical Exam  Nursing note and vitals reviewed. Constitutional: She is oriented to person, place, and time. She appears well-developed and well-nourished.  Respiratory: Effort normal.  Neurological: She is alert and oriented to person, place, and time.    Review of Systems  Constitutional: Negative.   Respiratory: Negative for cough and shortness of breath.   Psychiatric/Behavioral: Negative for agitation, behavioral problems, confusion, dysphoric mood, hallucinations, self-injury, sleep disturbance and suicidal ideas. The patient is not nervous/anxious and is not hyperactive.     Blood pressure 129/70, pulse (!) 122, temperature 98.6 F (37 C), temperature source Oral, resp. rate 18, height 5\' 1"  (1.549 m),  weight 109.3 kg, SpO2 98 %.Body mass index is 45.54 kg/m.  See MD's discharge SRA      Has this patient used any form of tobacco in the last 30 days? (Cigarettes, Smokeless Tobacco, Cigars, and/or Pipes)  No  Blood Alcohol level:  Lab Results  Component Value Date   ETH <10 99991111    Metabolic Disorder Labs:  Lab Results  Component Value Date   HGBA1C 5.2 09/16/2019   MPG 102.54 09/16/2019   No results found for: PROLACTIN Lab Results  Component Value Date   CHOL 131 09/16/2019   TRIG 50 09/16/2019   HDL 38 (L) 09/16/2019   CHOLHDL 3.4 09/16/2019   VLDL 10 09/16/2019   [place] 83 09/16/2019    See Psychiatric Specialty Exam and Suicide Risk Assessment completed by Attending Physician prior to discharge.  Discharge destination:  Home  Is patient on multiple antipsychotic therapies at discharge:  No   Has Patient had three or more failed trials of antipsychotic monotherapy by history:  No  Recommended Plan for Multiple Antipsychotic Therapies: NA  Discharge  Instructions    Discharge instructions   Complete by: As directed    Patient is instructed to take all prescribed medications as recommended. Report any side effects or adverse reactions to your outpatient psychiatrist. Patient is instructed to abstain from alcohol and illegal drugs while on prescription medications. In the event of worsening symptoms, patient is instructed to call the crisis hotline, 911, or go to the nearest emergency department for evaluation and treatment.     Allergies as of 09/18/2019      Reactions   Erythromycin Anaphylaxis   Facial swelling   Azithromycin Swelling   Macrobid [nitrofurantoin]    hives      Medication List    STOP taking these medications   albuterol 108 (90 Base) MCG/ACT inhaler Commonly known as: VENTOLIN HFA   clonazePAM 1 MG tablet Commonly known as: KLONOPIN   dimenhyDRINATE 50 MG tablet Commonly known as: DRAMAMINE   FLUoxetine 20 MG  tablet Commonly known as: PROZAC Replaced by: FLUoxetine 20 MG capsule   HYDROcodone-homatropine 5-1.5 MG/5ML syrup Commonly known as: HYCODAN   hydrOXYzine 25 MG tablet Commonly known as: ATARAX/VISTARIL   LORazepam 0.5 MG tablet Commonly known as: ATIVAN   QUEtiapine 100 MG tablet Commonly known as: SEROQUEL   sertraline 100 MG tablet Commonly known as: ZOLOFT   venlafaxine XR 37.5 MG 24 hr capsule Commonly known as: EFFEXOR-XR   zolpidem 5 MG tablet Commonly known as: AMBIEN     TAKE these medications     Indication  ARIPiprazole 5 MG tablet Commonly known as: ABILIFY Take 1 tablet (5 mg total) by mouth daily.  Indication: Major Depressive Disorder   FLUoxetine 20 MG capsule Commonly known as: PROZAC Take 1 capsule (20 mg total) by mouth daily. Replaces: FLUoxetine 20 MG tablet  Indication: Depression   norethindrone 5 MG tablet Commonly known as: AYGESTIN Take 10 mg by mouth daily.  Indication: hormonal therapy   pantoprazole 40 MG tablet Commonly known as: PROTONIX Take 40 mg by mouth daily.  Indication: Gastroesophageal Reflux Disease   traZODone 50 MG tablet Commonly known as: DESYREL Take 1 tablet (50 mg total) by mouth at bedtime as needed for sleep. What changed: how much to take  Indication: [facility]. Call on 09/20/2019.   Why: Because you are leaving on the weekend, it is not possible for social work staff to refer you to another provider.  Please contact this provider on 09/20/2019 and ask for the soonest possible follow-up appointments; tell them you were in the hospital. Contact information: [address] [place] [address] [phone]           Follow-up recommendations: Activity as tolerated. Diet as recommended by primary care physician. Keep all scheduled follow-up appointments as recommended.   Comments:   Patient is instructed to take all prescribed  medications as recommended. Report any side effects or adverse reactions to your outpatient psychiatrist. Patient is instructed to abstain from alcohol and illegal drugs while on prescription medications. In the event of worsening symptoms, patient is instructed to call the crisis hotline, 911, or go to the nearest emergency department for evaluation and treatment.  Signed: Connye Burkitt, NP 09/19/2019, 7:57 AM

## 2019-09-18 NOTE — Progress Notes (Signed)
Pt discharged to lobby. Pt was stable and appreciative at that time. All papers  were given and valuables returned. Verbal understanding expressed. Denies SI/HI and A/VH. Pt given opportunity to express concerns and ask questions. °

## 2019-09-18 NOTE — Progress Notes (Signed)
Patient reported to staff that she "jammed" her finger in the door. Finger doesn't appear to be injured- skin intact- no apparent swelling. Pt given ice pack to apply to finger. Pt returned to dayroom to participate in group.

## 2019-09-18 NOTE — Progress Notes (Signed)
  [facility] Adult Case Management Discharge Plan :  Will you be returning to the same living situation after discharge:  Yes,  with family At discharge, do you have transportation home?: Yes,  Family Do you have the ability to pay for your medications: Yes,  has an income and insurance  Release of information consent forms completed and emailed to Medical Records, then turned in to Medical Records by CSW.   Patient to Follow up at: Follow-up [facility]. Call on 09/20/2019.   Why: Because you are leaving on the weekend, it is not possible for social work staff to refer you to another provider.  Please contact this provider on 09/20/2019 and ask for the soonest possible follow-up appointments; tell them you were in the hospital. Contact information: [place] [address] [phone]         2  Next level of care provider has access to [place] and Suicide Prevention discussed: Yes,  with wife     Has patient been referred to the Quitline?: N/A patient is not a smoker  Patient has been referred for addiction treatment: [place], LCSW 09/18/2019, 1:38 PM

## 2019-09-22 DIAGNOSIS — J3489 Other specified disorders of nose and nasal sinuses: Secondary | ICD-10-CM | POA: Diagnosis not present

## 2019-09-28 DIAGNOSIS — F333 Major depressive disorder, recurrent, severe with psychotic symptoms: Secondary | ICD-10-CM | POA: Diagnosis not present

## 2019-09-29 DIAGNOSIS — N920 Excessive and frequent menstruation with regular cycle: Secondary | ICD-10-CM | POA: Diagnosis not present

## 2019-09-29 DIAGNOSIS — F418 Other specified anxiety disorders: Secondary | ICD-10-CM | POA: Diagnosis not present

## 2019-09-29 DIAGNOSIS — F319 Bipolar disorder, unspecified: Secondary | ICD-10-CM | POA: Diagnosis not present

## 2019-10-23 DIAGNOSIS — J029 Acute pharyngitis, unspecified: Secondary | ICD-10-CM | POA: Diagnosis not present

## 2019-10-23 DIAGNOSIS — Z3202 Encounter for pregnancy test, result negative: Secondary | ICD-10-CM | POA: Diagnosis not present

## 2019-10-23 DIAGNOSIS — R079 Chest pain, unspecified: Secondary | ICD-10-CM | POA: Diagnosis not present

## 2019-10-23 DIAGNOSIS — M549 Dorsalgia, unspecified: Secondary | ICD-10-CM | POA: Diagnosis not present

## 2019-10-23 DIAGNOSIS — I498 Other specified cardiac arrhythmias: Secondary | ICD-10-CM | POA: Diagnosis not present

## 2019-10-23 DIAGNOSIS — K219 Gastro-esophageal reflux disease without esophagitis: Secondary | ICD-10-CM | POA: Diagnosis not present

## 2019-10-25 DIAGNOSIS — R079 Chest pain, unspecified: Secondary | ICD-10-CM | POA: Diagnosis not present

## 2019-10-26 DIAGNOSIS — F333 Major depressive disorder, recurrent, severe with psychotic symptoms: Secondary | ICD-10-CM | POA: Diagnosis not present

## 2019-11-22 DIAGNOSIS — F333 Major depressive disorder, recurrent, severe with psychotic symptoms: Secondary | ICD-10-CM | POA: Diagnosis not present

## 2019-11-30 DIAGNOSIS — F333 Major depressive disorder, recurrent, severe with psychotic symptoms: Secondary | ICD-10-CM | POA: Diagnosis not present

## 2019-12-07 DIAGNOSIS — F333 Major depressive disorder, recurrent, severe with psychotic symptoms: Secondary | ICD-10-CM | POA: Diagnosis not present

## 2019-12-13 DIAGNOSIS — F333 Major depressive disorder, recurrent, severe with psychotic symptoms: Secondary | ICD-10-CM | POA: Diagnosis not present

## 2019-12-21 DIAGNOSIS — F333 Major depressive disorder, recurrent, severe with psychotic symptoms: Secondary | ICD-10-CM | POA: Diagnosis not present

## 2020-01-04 DIAGNOSIS — F333 Major depressive disorder, recurrent, severe with psychotic symptoms: Secondary | ICD-10-CM | POA: Diagnosis not present

## 2020-01-05 DIAGNOSIS — F333 Major depressive disorder, recurrent, severe with psychotic symptoms: Secondary | ICD-10-CM | POA: Diagnosis not present

## 2020-01-07 DIAGNOSIS — R7303 Prediabetes: Secondary | ICD-10-CM | POA: Diagnosis not present

## 2020-01-18 DIAGNOSIS — R0681 Apnea, not elsewhere classified: Secondary | ICD-10-CM | POA: Diagnosis not present

## 2020-01-19 DIAGNOSIS — N946 Dysmenorrhea, unspecified: Secondary | ICD-10-CM | POA: Diagnosis not present

## 2020-01-19 DIAGNOSIS — N920 Excessive and frequent menstruation with regular cycle: Secondary | ICD-10-CM | POA: Diagnosis not present

## 2020-01-19 DIAGNOSIS — D259 Leiomyoma of uterus, unspecified: Secondary | ICD-10-CM | POA: Diagnosis not present

## 2020-02-01 DIAGNOSIS — F333 Major depressive disorder, recurrent, severe with psychotic symptoms: Secondary | ICD-10-CM | POA: Diagnosis not present

## 2020-02-06 DIAGNOSIS — F333 Major depressive disorder, recurrent, severe with psychotic symptoms: Secondary | ICD-10-CM | POA: Diagnosis not present

## 2020-02-08 DIAGNOSIS — G4733 Obstructive sleep apnea (adult) (pediatric): Secondary | ICD-10-CM | POA: Diagnosis not present

## 2020-02-08 DIAGNOSIS — G4700 Insomnia, unspecified: Secondary | ICD-10-CM | POA: Diagnosis not present

## 2020-02-15 DIAGNOSIS — D259 Leiomyoma of uterus, unspecified: Secondary | ICD-10-CM | POA: Diagnosis not present

## 2020-02-15 DIAGNOSIS — N920 Excessive and frequent menstruation with regular cycle: Secondary | ICD-10-CM | POA: Diagnosis not present

## 2020-02-15 DIAGNOSIS — F333 Major depressive disorder, recurrent, severe with psychotic symptoms: Secondary | ICD-10-CM | POA: Diagnosis not present

## 2020-02-15 DIAGNOSIS — Z719 Counseling, unspecified: Secondary | ICD-10-CM | POA: Diagnosis not present

## 2020-02-21 ENCOUNTER — Other Ambulatory Visit: Payer: Self-pay | Admitting: Obstetrics and Gynecology

## 2020-02-22 DIAGNOSIS — F333 Major depressive disorder, recurrent, severe with psychotic symptoms: Secondary | ICD-10-CM | POA: Diagnosis not present

## 2020-02-24 DIAGNOSIS — N920 Excessive and frequent menstruation with regular cycle: Secondary | ICD-10-CM | POA: Diagnosis not present

## 2020-02-24 DIAGNOSIS — D259 Leiomyoma of uterus, unspecified: Secondary | ICD-10-CM | POA: Diagnosis not present

## 2020-02-27 DIAGNOSIS — D259 Leiomyoma of uterus, unspecified: Secondary | ICD-10-CM | POA: Diagnosis not present

## 2020-02-27 DIAGNOSIS — N870 Mild cervical dysplasia: Secondary | ICD-10-CM | POA: Diagnosis not present

## 2020-02-29 DIAGNOSIS — F333 Major depressive disorder, recurrent, severe with psychotic symptoms: Secondary | ICD-10-CM | POA: Diagnosis not present

## 2020-03-21 DIAGNOSIS — F333 Major depressive disorder, recurrent, severe with psychotic symptoms: Secondary | ICD-10-CM | POA: Diagnosis not present

## 2020-03-22 DIAGNOSIS — M1712 Unilateral primary osteoarthritis, left knee: Secondary | ICD-10-CM | POA: Diagnosis not present

## 2020-03-23 DIAGNOSIS — F411 Generalized anxiety disorder: Secondary | ICD-10-CM | POA: Diagnosis not present

## 2020-03-25 NOTE — H&P (Addendum)
Erica Neal is a 39 y.o.  female, P: 2-0-2-2 presents for hysterectomy because of symptomatic uterine fibroids, menorrhagia and dysmenorrhea.  For over 4 years the patient has endured dysmenorrhea that she attributes to her fibroids but over the past year her symptoms have worsened.  She states that she always has some bleeding requiring a panty liner with 4 day episodes in which she has to change her "heavy flow" pads 6 times a day.  She admits to cramping that is rated 8/10 when she bleeds the heaviest and 5/10 with a more normal flow.   She chooses not to take analagesia for her pain.  She goes on to deny any changes in her bladder function or vaginitis symptom but during her heaviest menstrual flow she will have loose bowel movements.  In November 2021 the patient had a hysteroscopy and ablation however her bleeding pattern did not improve.  Consequently she was prescribed norethindrone 10 mg daily for bleeding management. The curettings from her endometrium and endocervix showed inactive endometrium with leiomyoma and mild dysplasia. A pelvic ultrasound  in September 2021 revealed a retroverted uterus (5.6 cm from fundus to external os): 7.11 x 5.95 x 8.62 cm, endometrium: 3.95 mm; #5 fibroids: (intramural) anterior-1.12 cm, fundal-2.38 cm, 3.20 cm and 0.79 cm and right sub-serosal-1.85 cm; right ovary-2.90 cm and left ovary-1.58 cm.  Her  TSH and CBC  during this  evaluation were both normal.  A review of both medical and surgical management options were given to the patient regarding her symptoms, however she has chosen to proceed with definitive therapy in the form of hysterectomy.   Past Medical History  OB History: G:4; P: 2-0-2-2;  SVB: 2004 and 2016 (largest infant 7 lbs.)  GYN History: menarche: 81 YO;    LMP: 03/23/2020;    Contraception: (same sex relationship);   Denies history of abnormal PAP smear.   Last PAP smear-per patient-normal in 2020; Endometrial Curetting (2021) -Leiomyoma  and Inactive Endometrium; Endocervical Curetting (2021) Mild Dysplasia-CIN 1  Medical History: Depression, Anxiety, Migraine and GERD  Surgical History: 2021 Hysteroscopy, D&C, Endometrial Ablation (Novasure) Denies problems with anesthesia or history of blood transfusions  Family History: Eczema, Asthma, Hypertension and Pancreatic Cancer, Stroke and Diabetes Mellitus.  Social History:  Married and employed with [employer]; She denies tobacco use and occasionally uses alcohol   Medications: Latuda 40 mg daily Norethindrone 5 mg #2 tablets daily Pantoprazole 40 mg daily prn Trazadone Quetiapine 100 mg Prazosin 1 mg daily Lorazepam 0.5 mg tid prn Ibuprofen 800 mg pc every 8 hours prn Gabapentin 300 mg 1 po qam and #3 qhs Duloxetine 40 mg  #2 po qhs Buproprion 150 mg XL Aripipriazole  Allergies  Allergen Reactions   Erythromycin Anaphylaxis    Facial swelling   Azithromycin Swelling   Macrobid [Nitrofurantoin] Hives    Denies sensitivity to peanuts, shellfish, soy, latex or adhesives.  ROS: Admits to glasses/contact lenses;  Denies headache, vision changes, nasal congestion, dysphagia, tinnitus, dizziness, hoarseness, cough,  chest pain, shortness of breath, nausea, vomiting, diarrhea,constipation,  urinary frequency, urgency  dysuria, hematuria, vaginitis symptoms, pelvic pain, swelling of joints,easy bruising,  myalgias, arthralgias, skin rashes, unexplained weight loss and except as is mentioned in the history of present illness, patient's review of systems is otherwise negative.      Physical Exam  Bp:100/68;   P: 74 bpm;    R: 16;    Temperature:98.2 degrees F orally;        Weight: 271  lbs.    Height: 5\' 1" ;   BMI: 51.2  Neck: supple without masses or thyromegaly Lungs: clear to auscultation Heart: regular rate and rhythm Abdomen: soft, non-tender and no organomegaly Pelvic:EGBUS- wnl; vagina-normal rugae; uterus-normal size, cervix without lesions or motion  tenderness; adnexae-no tenderness or masses Extremities:  no clubbing, cyanosis or edema   Assesment: Menorrhagia                      Uterine Fibroids                      Dysmenorrhea   Disposition:  A discussion was held with patient regarding the indication for her procedure(s) along with the risks, which include but are not limited to: reaction to anesthesia, damage to adjacent organs, infection,  excessive bleeding and possible open abdominal incision.  The patient verbalized understanding of these risks and has consented to proceed with a Total Vaginal Hysterectomy, Bilateral Salpingectomy and Cystoscopy at [facility] on April 09, 2020 at 12:30 p.m.   CSN# [id number]   [place] J. Florene Glen, PA-C  for Dr. Harvie Bridge. Mancel Bale  Discussed possibility of laparatomy although unlikely and pt understands and is agreeable.  R/B/A reviewed with the patient including but not limited to bleeding infection and injury.  Questions answered and consent signed and witnessed.

## 2020-03-27 DIAGNOSIS — F411 Generalized anxiety disorder: Secondary | ICD-10-CM | POA: Diagnosis not present

## 2020-03-29 NOTE — Patient Instructions (Signed)
DUE TO COVID-19 ONLY ONE VISITOR IS ALLOWED IN WAITING ROOM (VISITOR WILL HAVE A TEMPERATURE CHECK ON ARRIVAL AND MUST WEAR A FACE MASK THE ENTIRE TIME.)  ONCE YOU ARE ADMITTED TO YOUR PRIVATE ROOM, THE SAME ONE VISITOR IS ALLOWED TO VISIT DURING VISITING HOURS ONLY.  Your COVID swab testing is scheduled for: 04/05/20 at  1:45 am , You must self quarantine after your testing per handout given to you at the testing site. [address] [place], [place] [address]  (Must self quarantine after testing. Follow instructions on handout.)       Your procedure is scheduled on: 04/09/20  Report to [facility] AT: 10:30 A. M.   Call this number if you have problems the morning of [phone].   OUR AD[address]ESS IS [address].  WE ARE LOCATED IN THE [place]                                   [facility].                                     REMEMBER:   DO NOT EAT SOLID FOOD AFTER MIDNIGHT .  CLEAR LIQUIDS UNTIL: 9:30 AM  CLEAR LIQUID DIET   Foods Allowed                                                                     Foods Excluded  Coffee and tea, regular and decaf                             liquids that you cannot  Plain Jell-O any favor except red or purple                                           see through such as: Fruit ices (not with fruit pulp)                                     milk, soups, orange juice  Iced Popsicles                                    All solid food Carbonated beverages, regular and diet                                    Cranberry, grape and apple juices Sports drinks like Gatorade Lightly seasoned clear broth or consume(fat free) Sugar, honey syrup  Sample Menu Breakfast                                Lunch  Supper Cranberry juice                    Beef broth                            Chicken broth Jell-O                                     Grape juice                           Apple  juice Coffee or tea                        Jell-O                                      Popsicle                                                Coffee or tea                        Coffee or tea  _____________________________________________________________________  BRUSH YOUR TEETH THE MORNING OF SURGERY.  TAKE THESE MEDICATIONS MORNING OF SURGERY WITH A SIP OF WATER: N/A. Use Flonase and lorazepam as needed.   DO NOT WEAR JEWERLY, MAKE UP, OR NAIL POLISH.  DO NOT WEAR LOTIONS, POWDERS, PERFUMES/COLOGNE OR DEODORANT.  DO NOT SHAVE FOR 24 HOURS PRIOR TO DAY OF SURGERY.  CONTACTS, GLASSES, OR DENTURES MAY NOT BE WORN TO SURGERY.                                    [facility] IS NOT RESPONSIBLE  FOR ANY BELONGINGS.                                                                    [facility] - Preparing for Surgery Before surgery, you can play an important role.  Because skin is not sterile, your skin needs to be as free of germs as possible.  You can reduce the number of germs on your skin by washing with CHG (chlorahexidine gluconate) soap before surgery.  CHG is an antiseptic cleaner which kills germs and bonds with the skin to continue killing germs even after washing. Please DO NOT use if you have an allergy to CHG or antibacterial soaps.  If your skin becomes reddened/irritated stop using the CHG and inform your nurse when you arrive at Short Stay. Do not shave (including legs and underarms) for at least 48 hours prior to the first CHG shower.  You may shave your face/neck. Please follow these instructions carefully:  1.  Shower with CHG Soap the night before surgery and the  morning of Surgery.  2.  If you choose to wash your hair, wash your hair first as usual with your  normal  shampoo.  3.  After you shampoo, rinse your hair and body thoroughly to remove the  shampoo.                           4.  Use CHG as you would any other liquid soap.  You can apply chg directly  to the skin  and wash                       Gently with a scrungie or clean washcloth.  5.  Apply the CHG Soap to your body ONLY FROM THE NECK DOWN.   Do not use on face/ open                           Wound or open sores. Avoid contact with eyes, ears mouth and genitals (private parts).                       Wash face,  Genitals (private parts) with your normal soap.             6.  Wash thoroughly, paying special attention to the area where your surgery  will be performed.  7.  Thoroughly rinse your body with warm water from the neck down.  8.  DO NOT shower/wash with your normal soap after using and rinsing off  the CHG Soap.                9.  Pat yourself dry with a clean towel.            10.  Wear clean pajamas.            11.  Place clean sheets on your bed the night of your first shower and do not  sleep with pets. Day of Surgery : Do not apply any lotions/deodorants the morning of surgery.  Please wear clean clothes to the hospital/surgery center.  FAILURE TO FOLLOW THESE INSTRUCTIONS MAY RESULT IN THE CANCELLATION OF YOUR SURGERY PATIENT SIGNATURE_________________________________  NURSE SIGNATURE__________________________________  ________________________________________________________________________

## 2020-03-30 ENCOUNTER — Encounter (HOSPITAL_COMMUNITY): Payer: Self-pay

## 2020-03-30 ENCOUNTER — Encounter (HOSPITAL_COMMUNITY)
Admission: RE | Admit: 2020-03-30 | Discharge: 2020-03-30 | Disposition: A | Discharge status: Home / Self Care | Payer: BC Managed Care – PPO | Source: Ambulatory Visit | Attending: Obstetrics and Gynecology | Admitting: Obstetrics and Gynecology

## 2020-03-30 ENCOUNTER — Other Ambulatory Visit: Payer: Self-pay

## 2020-03-30 DIAGNOSIS — Z01818 Encounter for other preprocedural examination: Secondary | ICD-10-CM | POA: Diagnosis not present

## 2020-03-30 HISTORY — DX: Unspecified osteoarthritis, unspecified site: M19.90

## 2020-03-30 HISTORY — DX: Depression, unspecified: F32.A

## 2020-03-30 HISTORY — DX: Cardiac arrhythmia, unspecified: I49.9

## 2020-03-30 LAB — BASIC METABOLIC PANEL
Anion gap: 3 — ABNORMAL LOW (ref 5–15)
BUN: 9 mg/dL (ref 6–20)
CO2: 30 mmol/L (ref 22–32)
Calcium: 9.1 mg/dL (ref 8.9–10.3)
Chloride: 103 mmol/L (ref 98–111)
Creatinine, Ser: 0.72 mg/dL (ref 0.44–1.00)
GFR, Estimated: >60 mL/min (ref >60)
Glucose, Bld: 91 mg/dL (ref 70–99)
Potassium: 4.4 mmol/L (ref 3.5–5.1)
Sodium: 136 mmol/L (ref 135–145)

## 2020-03-30 LAB — CBC
HCT: 39.4 % (ref 36.0–46.0)
Hemoglobin: 13.3 g/dL (ref 12.0–15.0)
MCH: 32.4 pg (ref 26.0–34.0)
MCHC: 33.8 g/dL (ref 30.0–36.0)
MCV: 95.9 fL (ref 80.0–100.0)
Platelets: 217 10*3/uL (ref 150–400)
RBC: 4.11 MIL/uL (ref 3.87–5.11)
RDW: 12.5 % (ref 11.5–15.5)
WBC: 5.2 10*3/uL (ref 4.0–10.5)
nRBC: 0 % (ref 0.0–0.2)

## 2020-03-30 NOTE — Progress Notes (Addendum)
COVID Vaccine Completed: Yes Date COVID Vaccine completed:11/21/19 COVID vaccine manufacturer: Moderna    PCP - Mal Misty: DO Cardiologist - NO  Chest x-ray -  EKG - 09/15/19. EPIC Stress Test -  ECHO -  Cardiac Cath -  Pacemaker/ICD device last checked:  Sleep Study -  CPAP -   Fasting Blood Sugar -  Checks Blood Sugar _____ times a day  Blood Thinner Instructions: Aspirin Instructions: Last Dose:  Anesthesia review: Hx: PVC's as per pt.  Patient denies shortness of breath, fever, cough and chest pain at PAT appointment   Patient verbalized understanding of instructions that were given to them at the PAT appointment. Patient was also instructed that they will need to review over the PAT instructions again at home before surgery.

## 2020-04-05 ENCOUNTER — Other Ambulatory Visit (HOSPITAL_COMMUNITY)
Admission: RE | Admit: 2020-04-05 | Discharge: 2020-04-05 | Disposition: A | Discharge status: Home / Self Care | Payer: BC Managed Care – PPO | Source: Ambulatory Visit | Attending: Obstetrics and Gynecology | Admitting: Obstetrics and Gynecology

## 2020-04-05 DIAGNOSIS — Z20822 Contact with and (suspected) exposure to covid-19: Secondary | ICD-10-CM | POA: Insufficient documentation

## 2020-04-05 LAB — SARS CORONAVIRUS 2 (TAT 6-24 HRS): SARS Coronavirus 2: NEGATIVE

## 2020-04-09 ENCOUNTER — Ambulatory Visit (HOSPITAL_BASED_OUTPATIENT_CLINIC_OR_DEPARTMENT_OTHER): Payer: BC Managed Care – PPO | Admitting: Physician Assistant

## 2020-04-09 ENCOUNTER — Observation Stay (HOSPITAL_BASED_OUTPATIENT_CLINIC_OR_DEPARTMENT_OTHER)
Admission: RE | Admit: 2020-04-09 | Discharge: 2020-04-10 | Disposition: A | Discharge status: Home / Self Care | Payer: BC Managed Care – PPO | Attending: Obstetrics and Gynecology | Admitting: Obstetrics and Gynecology

## 2020-04-09 ENCOUNTER — Encounter (HOSPITAL_BASED_OUTPATIENT_CLINIC_OR_DEPARTMENT_OTHER)
Admission: RE | Disposition: A | Discharge status: Home / Self Care | Payer: Self-pay | Source: Home / Self Care | Attending: Obstetrics and Gynecology

## 2020-04-09 ENCOUNTER — Ambulatory Visit (HOSPITAL_BASED_OUTPATIENT_CLINIC_OR_DEPARTMENT_OTHER): Payer: BC Managed Care – PPO | Admitting: Anesthesiology

## 2020-04-09 ENCOUNTER — Encounter (HOSPITAL_BASED_OUTPATIENT_CLINIC_OR_DEPARTMENT_OTHER): Payer: Self-pay | Admitting: Obstetrics and Gynecology

## 2020-04-09 DIAGNOSIS — N946 Dysmenorrhea, unspecified: Secondary | ICD-10-CM | POA: Diagnosis not present

## 2020-04-09 DIAGNOSIS — D250 Submucous leiomyoma of uterus: Secondary | ICD-10-CM | POA: Diagnosis not present

## 2020-04-09 DIAGNOSIS — F418 Other specified anxiety disorders: Secondary | ICD-10-CM | POA: Diagnosis not present

## 2020-04-09 DIAGNOSIS — N800 Endometriosis of uterus: Secondary | ICD-10-CM | POA: Insufficient documentation

## 2020-04-09 DIAGNOSIS — N920 Excessive and frequent menstruation with regular cycle: Secondary | ICD-10-CM | POA: Diagnosis not present

## 2020-04-09 DIAGNOSIS — N879 Dysplasia of cervix uteri, unspecified: Secondary | ICD-10-CM | POA: Diagnosis not present

## 2020-04-09 DIAGNOSIS — N888 Other specified noninflammatory disorders of cervix uteri: Secondary | ICD-10-CM | POA: Insufficient documentation

## 2020-04-09 DIAGNOSIS — N83292 Other ovarian cyst, left side: Secondary | ICD-10-CM | POA: Diagnosis not present

## 2020-04-09 DIAGNOSIS — D259 Leiomyoma of uterus, unspecified: Principal | ICD-10-CM | POA: Insufficient documentation

## 2020-04-09 DIAGNOSIS — D251 Intramural leiomyoma of uterus: Secondary | ICD-10-CM | POA: Diagnosis not present

## 2020-04-09 DIAGNOSIS — N83202 Unspecified ovarian cyst, left side: Secondary | ICD-10-CM | POA: Diagnosis not present

## 2020-04-09 HISTORY — PX: CYSTOSCOPY: SHX5120

## 2020-04-09 HISTORY — PX: BILATERAL SALPINGECTOMY: SHX5743

## 2020-04-09 HISTORY — PX: VAGINAL HYSTERECTOMY: SHX2639

## 2020-04-09 LAB — ABO/RH: ABO/RH(D): O POS

## 2020-04-09 LAB — TYPE AND SCREEN
ABO/RH(D): O POS
Antibody Screen: NEGATIVE

## 2020-04-09 LAB — POCT PREGNANCY, URINE: Preg Test, Ur: NEGATIVE

## 2020-04-09 SURGERY — HYSTERECTOMY, VAGINAL
Anesthesia: General | Site: Vagina

## 2020-04-09 MED ORDER — SODIUM CHLORIDE 0.9 % IR SOLN
Status: DC | PRN
  Administered 2020-04-09: 1000 mL via INTRAVESICAL

## 2020-04-09 MED ORDER — KETOROLAC TROMETHAMINE 30 MG/ML IJ SOLN
30.0000 mg | Freq: Four times a day (QID) | INTRAMUSCULAR | Status: AC
  Administered 2020-04-09 – 2020-04-10 (×2): 30 mg via INTRAVENOUS

## 2020-04-09 MED ORDER — KETOROLAC TROMETHAMINE 30 MG/ML IJ SOLN
INTRAMUSCULAR | Status: AC
  Filled 2020-04-09: qty 1

## 2020-04-09 MED ORDER — VASOPRESSIN 20 UNIT/ML IV SOLN
INTRAVENOUS | Status: DC | PRN
  Administered 2020-04-09: 13:00:00 11 mL via INTRAMUSCULAR

## 2020-04-09 MED ORDER — ROCURONIUM BROMIDE 10 MG/ML (PF) SYRINGE
PREFILLED_SYRINGE | INTRAVENOUS | Status: AC
  Filled 2020-04-09: qty 10

## 2020-04-09 MED ORDER — ONDANSETRON HCL 4 MG/2ML IJ SOLN
INTRAMUSCULAR | Status: AC
  Filled 2020-04-09: qty 2

## 2020-04-09 MED ORDER — ROCURONIUM BROMIDE 10 MG/ML (PF) SYRINGE
PREFILLED_SYRINGE | INTRAVENOUS | Status: DC | PRN
  Administered 2020-04-09: 80 mg via INTRAVENOUS

## 2020-04-09 MED ORDER — KETOROLAC TROMETHAMINE 30 MG/ML IJ SOLN
INTRAMUSCULAR | Status: DC | PRN
  Administered 2020-04-09: 30 mg via INTRAVENOUS

## 2020-04-09 MED ORDER — INDIGOTINDISULFONATE SODIUM 8 MG/ML IJ SOLN
INTRAMUSCULAR | Status: DC | PRN
  Administered 2020-04-09: 5 mL via INTRAVENOUS

## 2020-04-09 MED ORDER — LACTATED RINGERS IV SOLN: INTRAVENOUS | Status: DC

## 2020-04-09 MED ORDER — DEXAMETHASONE SODIUM PHOSPHATE 10 MG/ML IJ SOLN
INTRAMUSCULAR | Status: AC
  Filled 2020-04-09: qty 1

## 2020-04-09 MED ORDER — CEFAZOLIN SODIUM-DEXTROSE 2-4 GM/100ML-% IV SOLN
INTRAVENOUS | Status: AC
  Filled 2020-04-09: qty 100

## 2020-04-09 MED ORDER — MEPERIDINE HCL 25 MG/ML IJ SOLN: 6.2500 mg | INTRAMUSCULAR | Status: DC | PRN

## 2020-04-09 MED ORDER — KETAMINE HCL 10 MG/ML IJ SOLN
INTRAMUSCULAR | Status: DC | PRN
  Administered 2020-04-09: 30 mg via INTRAVENOUS

## 2020-04-09 MED ORDER — HYDROMORPHONE HCL 1 MG/ML IJ SOLN
1.0000 mg | INTRAMUSCULAR | Status: DC | PRN
  Administered 2020-04-10: 1 mg via INTRAVENOUS

## 2020-04-09 MED ORDER — ACETAMINOPHEN 10 MG/ML IV SOLN
INTRAVENOUS | Status: DC | PRN
  Administered 2020-04-09: 1000 mg via INTRAVENOUS

## 2020-04-09 MED ORDER — MIDAZOLAM HCL 2 MG/2ML IJ SOLN
INTRAMUSCULAR | Status: AC
  Filled 2020-04-09: qty 2

## 2020-04-09 MED ORDER — ACETAMINOPHEN 10 MG/ML IV SOLN
INTRAVENOUS | Status: AC
  Filled 2020-04-09: qty 100

## 2020-04-09 MED ORDER — SCOPOLAMINE 1 MG/3DAYS TD PT72
MEDICATED_PATCH | TRANSDERMAL | Status: DC | PRN
  Administered 2020-04-09: 1 via TRANSDERMAL

## 2020-04-09 MED ORDER — MIDAZOLAM HCL 2 MG/2ML IJ SOLN
INTRAMUSCULAR | Status: DC | PRN
  Administered 2020-04-09: 2 mg via INTRAVENOUS

## 2020-04-09 MED ORDER — FENTANYL CITRATE (PF) 250 MCG/5ML IJ SOLN
INTRAMUSCULAR | Status: AC
  Filled 2020-04-09: qty 5

## 2020-04-09 MED ORDER — DULOXETINE HCL 20 MG PO CPEP
80.0000 mg | ORAL_CAPSULE | Freq: Every day | ORAL | Status: DC
  Administered 2020-04-09: 22:00:00 80 mg via ORAL
  Filled 2020-04-09: qty 4

## 2020-04-09 MED ORDER — OXYCODONE-ACETAMINOPHEN 5-325 MG PO TABS
ORAL_TABLET | ORAL | Status: AC
  Filled 2020-04-09: qty 2

## 2020-04-09 MED ORDER — CEFAZOLIN SODIUM-DEXTROSE 2-4 GM/100ML-% IV SOLN
2.0000 g | INTRAVENOUS | Status: AC
  Administered 2020-04-09: 13:00:00 2 g via INTRAVENOUS

## 2020-04-09 MED ORDER — PROPOFOL 10 MG/ML IV BOLUS
INTRAVENOUS | Status: AC
  Filled 2020-04-09: qty 20

## 2020-04-09 MED ORDER — DOCUSATE SODIUM 100 MG PO CAPS
100.0000 mg | ORAL_CAPSULE | Freq: Two times a day (BID) | ORAL | Status: DC
  Administered 2020-04-09: 22:00:00 100 mg via ORAL

## 2020-04-09 MED ORDER — PRAZOSIN HCL 1 MG PO CAPS
1.0000 mg | ORAL_CAPSULE | Freq: Every day | ORAL | Status: DC
  Administered 2020-04-09: 22:00:00 1 mg via ORAL
  Filled 2020-04-09: qty 1

## 2020-04-09 MED ORDER — 0.9 % SODIUM CHLORIDE (POUR BTL) OPTIME
TOPICAL | Status: DC | PRN
  Administered 2020-04-09: 13:00:00 500 mL

## 2020-04-09 MED ORDER — PROMETHAZINE HCL 25 MG/ML IJ SOLN
6.2500 mg | INTRAMUSCULAR | Status: DC | PRN
Start: 2020-04-09 — End: 2020-04-10

## 2020-04-09 MED ORDER — LIDOCAINE HCL (PF) 2 % IJ SOLN
INTRAMUSCULAR | Status: AC
  Filled 2020-04-09: qty 5

## 2020-04-09 MED ORDER — SIMETHICONE 80 MG PO CHEW: 80.0000 mg | CHEWABLE_TABLET | Freq: Four times a day (QID) | ORAL | Status: DC | PRN

## 2020-04-09 MED ORDER — HYDROMORPHONE HCL 1 MG/ML IJ SOLN
INTRAMUSCULAR | Status: AC
  Filled 2020-04-09: qty 1

## 2020-04-09 MED ORDER — SUGAMMADEX SODIUM 200 MG/2ML IV SOLN
INTRAVENOUS | Status: DC | PRN
  Administered 2020-04-09: 200 mg via INTRAVENOUS

## 2020-04-09 MED ORDER — DEXAMETHASONE SODIUM PHOSPHATE 10 MG/ML IJ SOLN
INTRAMUSCULAR | Status: DC | PRN
  Administered 2020-04-09: 10 mg via INTRAVENOUS

## 2020-04-09 MED ORDER — FENTANYL CITRATE (PF) 100 MCG/2ML IJ SOLN
INTRAMUSCULAR | Status: DC | PRN
  Administered 2020-04-09 (×7): 50 ug via INTRAVENOUS

## 2020-04-09 MED ORDER — HYDROMORPHONE HCL 1 MG/ML IJ SOLN
0.2500 mg | INTRAMUSCULAR | Status: DC | PRN
  Administered 2020-04-09: 0.5 mg via INTRAVENOUS
  Administered 2020-04-09 (×2): 0.25 mg via INTRAVENOUS
  Administered 2020-04-09: 0.5 mg via INTRAVENOUS
  Administered 2020-04-09: 0.25 mg via INTRAVENOUS

## 2020-04-09 MED ORDER — OXYCODONE-ACETAMINOPHEN 5-325 MG PO TABS
1.0000 | ORAL_TABLET | Freq: Four times a day (QID) | ORAL | Status: DC | PRN
  Administered 2020-04-09 – 2020-04-10 (×2): 2 via ORAL

## 2020-04-09 MED ORDER — MENTHOL 3 MG MT LOZG: 1.0000 | LOZENGE | OROMUCOSAL | Status: DC | PRN

## 2020-04-09 MED ORDER — LIDOCAINE 2% (20 MG/ML) 5 ML SYRINGE
INTRAMUSCULAR | Status: DC | PRN
  Administered 2020-04-09: 1.5 mg/kg/h via INTRAVENOUS
  Administered 2020-04-09: 100 mg via INTRAVENOUS

## 2020-04-09 MED ORDER — PROPOFOL 10 MG/ML IV BOLUS
INTRAVENOUS | Status: DC | PRN
  Administered 2020-04-09: 150 mg via INTRAVENOUS
  Administered 2020-04-09: 20 mg via INTRAVENOUS

## 2020-04-09 MED ORDER — KETOROLAC TROMETHAMINE 30 MG/ML IJ SOLN: 30.0000 mg | Freq: Once | INTRAMUSCULAR | Status: AC | PRN

## 2020-04-09 MED ORDER — ONDANSETRON HCL 4 MG/2ML IJ SOLN
INTRAMUSCULAR | Status: DC | PRN
  Administered 2020-04-09: 4 mg via INTRAVENOUS

## 2020-04-09 MED ORDER — LIDOCAINE HCL (PF) 2 % IJ SOLN
INTRAMUSCULAR | Status: AC
  Filled 2020-04-09: qty 10

## 2020-04-09 MED ORDER — POVIDONE-IODINE 10 % EX SWAB: 2.0000 "application " | Freq: Once | CUTANEOUS | Status: DC

## 2020-04-09 MED ORDER — FENTANYL CITRATE (PF) 100 MCG/2ML IJ SOLN
INTRAMUSCULAR | Status: AC
  Filled 2020-04-09: qty 2

## 2020-04-09 MED ORDER — SCOPOLAMINE 1 MG/3DAYS TD PT72
MEDICATED_PATCH | TRANSDERMAL | Status: AC
  Filled 2020-04-09: qty 1

## 2020-04-09 MED ORDER — DOCUSATE SODIUM 100 MG PO CAPS
ORAL_CAPSULE | ORAL | Status: AC
  Filled 2020-04-09: qty 1

## 2020-04-09 MED ORDER — SUGAMMADEX SODIUM 500 MG/5ML IV SOLN
INTRAVENOUS | Status: AC
  Filled 2020-04-09: qty 5

## 2020-04-09 MED ORDER — CHLORHEXIDINE GLUCONATE 0.12 % MT SOLN: 15.0000 mL | Freq: Once | OROMUCOSAL | Status: DC

## 2020-04-09 MED ORDER — KETAMINE HCL 10 MG/ML IJ SOLN
INTRAMUSCULAR | Status: AC
  Filled 2020-04-09: qty 1

## 2020-04-09 MED ORDER — ONDANSETRON HCL 4 MG PO TABS: 4.0000 mg | ORAL_TABLET | Freq: Four times a day (QID) | ORAL | Status: DC | PRN

## 2020-04-09 MED ORDER — ONDANSETRON HCL 4 MG/2ML IJ SOLN: 4.0000 mg | Freq: Four times a day (QID) | INTRAMUSCULAR | Status: DC | PRN

## 2020-04-09 MED ORDER — ORAL CARE MOUTH RINSE: 15.0000 mL | Freq: Once | OROMUCOSAL | Status: DC

## 2020-04-09 MED ORDER — VASOPRESSIN 20 UNIT/ML IV SOLN: INTRAVENOUS | Status: DC | PRN

## 2020-04-09 SURGICAL SUPPLY — 38 items
CANISTER SUCT 3000ML PPV (MISCELLANEOUS) ×5 IMPLANT
COVER WAND RF STERILE (DRAPES) ×5 IMPLANT
DECANTER SPIKE VIAL GLASS SM (MISCELLANEOUS) ×5 IMPLANT
DRAPE SHEET LG 3/4 BI-LAMINATE (DRAPES) ×5 IMPLANT
DRAPE STERI URO 9X17 APER PCH (DRAPES) ×5 IMPLANT
GLOVE BIO SURGEON STRL SZ7.5 (GLOVE) ×5 IMPLANT
GLOVE BIOGEL PI IND STRL 6.5 (GLOVE) ×3 IMPLANT
GLOVE BIOGEL PI IND STRL 7.0 (GLOVE) ×3 IMPLANT
GLOVE BIOGEL PI IND STRL 7.5 (GLOVE) ×3 IMPLANT
GLOVE BIOGEL PI INDICATOR 6.5 (GLOVE) ×2
GLOVE BIOGEL PI INDICATOR 7.0 (GLOVE) ×2
GLOVE BIOGEL PI INDICATOR 7.5 (GLOVE) ×2
GOWN STRL REUS W/ TWL LRG LVL3 (GOWN DISPOSABLE) ×12 IMPLANT
GOWN STRL REUS W/TWL LRG LVL3 (GOWN DISPOSABLE) ×20
HEMOSTAT ARISTA ABSORB 3G PWDR (HEMOSTASIS) ×5 IMPLANT
HEMOSTAT SURGICEL 2X14 (HEMOSTASIS) ×5 IMPLANT
IV NS 500ML (IV SOLUTION) ×5
IV NS 500ML BAXH (IV SOLUTION) ×3 IMPLANT
KIT TURNOVER CYSTO (KITS) ×5 IMPLANT
NEEDLE HYPO 22GX1.5 SAFETY (NEEDLE) ×5 IMPLANT
NEEDLE MAYO CATGUT SZ4 (NEEDLE) IMPLANT
NS IRRIG 1000ML POUR BTL (IV SOLUTION) ×5 IMPLANT
PACK VAGINAL WOMENS (CUSTOM PROCEDURE TRAY) ×5 IMPLANT
PAD OB MATERNITY 4.3X12.25 (PERSONAL CARE ITEMS) ×5 IMPLANT
SET IRRIG Y TYPE TUR BLADDER L (SET/KITS/TRAYS/PACK) ×5 IMPLANT
SPECIMEN JAR MEDIUM (MISCELLANEOUS) IMPLANT
SUT VIC AB 0 CT1 18XCR BRD8 (SUTURE) ×12 IMPLANT
SUT VIC AB 0 CT1 27 (SUTURE) ×15
SUT VIC AB 0 CT1 27XBRD ANBCTR (SUTURE) ×9 IMPLANT
SUT VIC AB 0 CT1 8-18 (SUTURE) ×20
SUT VIC AB 2-0 SH 27 (SUTURE) ×10
SUT VIC AB 2-0 SH 27XBRD (SUTURE) ×6 IMPLANT
SUT VICRYL 0 TIES 12 18 (SUTURE) ×5 IMPLANT
SYR BULB IRRIG 60ML STRL (SYRINGE) ×5 IMPLANT
SYR TB 1ML LL NO SAFETY (SYRINGE) ×5 IMPLANT
TOWEL OR 17X26 10 PK STRL BLUE (TOWEL DISPOSABLE) ×10 IMPLANT
TRAY FOLEY W/BAG SLVR 14FR (SET/KITS/TRAYS/PACK) ×5 IMPLANT
UNDERPAD 30X36 HEAVY ABSORB (UNDERPADS AND DIAPERS) ×5 IMPLANT

## 2020-04-09 NOTE — Anesthesia Procedure Notes (Signed)
Procedure Name: Intubation Date/Time: 04/09/2020 12:45 PM Performed by: Mechele Claude, CRNA Pre-anesthesia Checklist: Patient identified, Emergency Drugs available, Suction available and Patient being monitored Patient Re-evaluated:Patient Re-evaluated prior to induction Oxygen Delivery Method: Circle system utilized Preoxygenation: Pre-oxygenation with 100% oxygen Induction Type: IV induction Ventilation: Mask ventilation without difficulty Laryngoscope Size: Mac and 3 Grade View: Grade I Tube type: Oral Number of attempts: 1 Airway Equipment and Method: Stylet and Oral airway Placement Confirmation: ETT inserted through vocal cords under direct vision,  positive ETCO2 and breath sounds checked- equal and bilateral Secured at: 20 cm Tube secured with: Tape Dental Injury: Teeth and Oropharynx as per pre-operative assessment

## 2020-04-09 NOTE — Anesthesia Preprocedure Evaluation (Signed)
Anesthesia Evaluation  Patient identified by MRN, date of birth, ID band Patient awake    Reviewed: Allergy & Precautions, NPO status , Patient's Chart, lab work & pertinent test results  Airway Mallampati: II       Dental no notable dental hx.    Pulmonary neg pulmonary ROS,    Pulmonary exam normal        Cardiovascular Normal cardiovascular exam     Neuro/Psych PSYCHIATRIC DISORDERS Anxiety Depression negative neurological ROS     GI/Hepatic negative GI ROS, Neg liver ROS,   Endo/Other  Morbid obesity  Renal/GU negative Renal ROS  negative genitourinary   Musculoskeletal   Abdominal (+) + obese,   Peds  Hematology   Anesthesia Other Findings   Reproductive/Obstetrics negative OB ROS                             Anesthesia Physical Anesthesia Plan  ASA: III  Anesthesia Plan: General   Post-op Pain Management:    Induction: Intravenous  PONV Risk Score and Plan: 4 or greater and Ondansetron, Dexamethasone and Midazolam  Airway Management Planned: Oral ETT  Additional Equipment: None  Intra-op Plan:   Post-operative Plan: Extubation in OR  Informed Consent: I have reviewed the patients History and Physical, chart, labs and discussed the procedure including the risks, benefits and alternatives for the proposed anesthesia with the patient or authorized representative who has indicated his/her understanding and acceptance.     Dental advisory given  Plan Discussed with:   Anesthesia Plan Comments:         Anesthesia Quick Evaluation

## 2020-04-09 NOTE — Transfer of Care (Signed)
Immediate Anesthesia Transfer of Care Note  Patient: Erica Neal  Procedure(s) Performed: Procedure(s) (LRB): HYSTERECTOMY VAGINAL (N/A) BILATERAL SALPINGECTOMY (Bilateral) CYSTOSCOPY (N/A)  Patient Location: PACU  Anesthesia Type: General  Level of Consciousness: awake, alert  and oriented  Airway & Oxygen Therapy: Patient Spontanous Breathing and Patient connected to face mask oxygen  Post-op Assessment: Report given to PACU RN and Post -op Vital signs reviewed and stable  Post vital signs: Reviewed and stable  Complications: No apparent anesthesia complications Last Vitals:  Vitals Value Taken Time  BP 138/82 04/09/20 1537  Temp 36.9 C 04/09/20 1537  Pulse 100 04/09/20 1543  Resp 11 04/09/20 1543  SpO2 96 % 04/09/20 1543  Vitals shown include unvalidated device data.  Last Pain:  Vitals:   04/09/20 1100  TempSrc: Oral  PainSc: 0-No pain      Patients Stated Pain Goal: 3 (36/62/94 7654)  Complications: No complications documented.

## 2020-04-09 NOTE — Op Note (Signed)
Preop Diagnosis: 1.Menorrhagia 2.Failed Novasure Ablation  Postop Diagnosis: 1.Menorrhagia 2.Failed Novasure Ablation  Procedure: 1.HYSTERECTOMY VAGINAL 2.BILATERAL SALPINGECTOMY  3.CYSTOSCOPY  Anesthesia: General   Anesthesiologist: Lyn Hollingshead, MD   Attending: Everett Graff, MD   Assistant: Earnstine Regal, PA-C  Findings: Nl appearing ovaries and tubes with a left hemorrhagic cyst  Pathology: Uterus, cervix and bilateral fallopian tubes  Fluids: 2500 cc  UOP: 800 cc  EBL: 372 cc  Complications: None  Procedure: The patient was taken to the operating room after the risks, benefits and alternatives were discussed with the patient. The patient verbalized understanding and consent signed and witnessed. The patient was placed under general anesthesia per the anesthesiologist and a timeout was performed per protocol. The patient was prepped and draped in the normal sterile fashion in the dorsal lithotomy position.  A weighted speculum was placed the patient's vagina and the anterior lip of the cervix was grasped with a single-tooth tenaculum and Dever retractors were placed for vaginal wall retraction. The cervix was circumscribed with the bovie after injecting the cervix with pitressin at a concentration of 20 units of Pitressin in 100 cc of normal saline.  Once the cervix was circumscribed the anterior cul-de-sac was entered without difficulty.  The tissue was noted to tear easily through the single tooth tenaculum so a Jacobs tenaculum was then used.  The posterior cul-de-sac was entered without difficulty as well.  Curved Heaney clamps were used to clamp the uterosacral and cardinal ligaments and the tissue was then cut and suture ligated using 0 Vicryl. This was done bilaterally and sequentially. The remaining parametrial tissue was clamped, cut and suture ligated using 0 Vicryl in a sequential and bilateral fashion as well.  The uterine fundus was exteriorized and the  remaining pedicles were bilaterally clamped, cut and suture ligated with 0 vicryl.  The uterus and cervix were handed off to be sent to pathology. The bilateral ovaries and fallopian tubes appeared to be within normal limits. A babcock was used to grasp the left fallopian tube which was clamped at its base and ligated with 2-0 vicryl and then suture ligated with 2-0 vicryl.  The left hemorrhagic cyst continued to bleed and ultimately was made hemostatic with a figure of eight stitch of 2-0 vicryl.  Arista was placed on the remainder of the ovary.  The angles of the cuff were sutured using 0 vicryl.  The cuff was then repaired to the midline with figure-of-eight and interrupted stitches of 0 Vicryl.  The cuff was noted to be hemostatic.  Indigo Carmine was administered and bilateral ureters were noted to efflux without difficulty and no inadvertent bladder injury was noted.  The patient tolerated the procedure well and was awaiting extubation and transfer to the recovery room in stable condition.  I was present and scrubbed and the assistant was required due to complexity of the anatomy.

## 2020-04-10 ENCOUNTER — Encounter (HOSPITAL_BASED_OUTPATIENT_CLINIC_OR_DEPARTMENT_OTHER): Payer: Self-pay | Admitting: Obstetrics and Gynecology

## 2020-04-10 DIAGNOSIS — N946 Dysmenorrhea, unspecified: Secondary | ICD-10-CM | POA: Diagnosis not present

## 2020-04-10 DIAGNOSIS — D259 Leiomyoma of uterus, unspecified: Secondary | ICD-10-CM | POA: Diagnosis not present

## 2020-04-10 DIAGNOSIS — N800 Endometriosis of uterus: Secondary | ICD-10-CM | POA: Diagnosis not present

## 2020-04-10 DIAGNOSIS — N888 Other specified noninflammatory disorders of cervix uteri: Secondary | ICD-10-CM | POA: Diagnosis not present

## 2020-04-10 DIAGNOSIS — N83292 Other ovarian cyst, left side: Secondary | ICD-10-CM | POA: Diagnosis not present

## 2020-04-10 LAB — CBC
HCT: 33.6 % — ABNORMAL LOW (ref 36.0–46.0)
Hemoglobin: 11.1 g/dL — ABNORMAL LOW (ref 12.0–15.0)
MCH: 32 pg (ref 26.0–34.0)
MCHC: 33 g/dL (ref 30.0–36.0)
MCV: 96.8 fL (ref 80.0–100.0)
Platelets: 208 10*3/uL (ref 150–400)
RBC: 3.47 MIL/uL — ABNORMAL LOW (ref 3.87–5.11)
RDW: 12.8 % (ref 11.5–15.5)
WBC: 10.3 10*3/uL (ref 4.0–10.5)
nRBC: 0 % (ref 0.0–0.2)

## 2020-04-10 LAB — BASIC METABOLIC PANEL
Anion gap: 7 (ref 5–15)
BUN: 6 mg/dL (ref 6–20)
CO2: 26 mmol/L (ref 22–32)
Calcium: 8.5 mg/dL — ABNORMAL LOW (ref 8.9–10.3)
Chloride: 101 mmol/L (ref 98–111)
Creatinine, Ser: 0.76 mg/dL (ref 0.44–1.00)
GFR, Estimated: >60 mL/min (ref >60)
Glucose, Bld: 113 mg/dL — ABNORMAL HIGH (ref 70–99)
Potassium: 4.3 mmol/L (ref 3.5–5.1)
Sodium: 134 mmol/L — ABNORMAL LOW (ref 135–145)

## 2020-04-10 MED ORDER — KETOROLAC TROMETHAMINE 30 MG/ML IJ SOLN
INTRAMUSCULAR | Status: AC
  Filled 2020-04-10: qty 1

## 2020-04-10 MED ORDER — LORAZEPAM 0.5 MG PO TABS
0.2500 mg | ORAL_TABLET | ORAL | Status: DC | PRN
  Administered 2020-04-10: 02:00:00 0.25 mg via ORAL
  Filled 2020-04-10: qty 0.5

## 2020-04-10 MED ORDER — OXYCODONE-ACETAMINOPHEN 5-325 MG PO TABS
ORAL_TABLET | ORAL | Status: AC
  Filled 2020-04-10: qty 2

## 2020-04-10 MED ORDER — HYDROMORPHONE HCL 1 MG/ML IJ SOLN
INTRAMUSCULAR | Status: AC
  Filled 2020-04-10: qty 1

## 2020-04-10 MED ORDER — OXYCODONE HCL 5 MG PO TABS
10.0000 mg | ORAL_TABLET | ORAL | Status: AC
  Administered 2020-04-10: 08:00:00 10 mg via ORAL

## 2020-04-10 MED ORDER — OXYCODONE HCL 5 MG PO TABS
ORAL_TABLET | ORAL | Status: AC
  Filled 2020-04-10: qty 2

## 2020-04-10 MED ORDER — OXYCODONE-ACETAMINOPHEN 5-325 MG PO TABS: ORAL_TABLET | ORAL | 0 refills | Status: DC

## 2020-04-10 MED ORDER — IBUPROFEN 600 MG PO TABS: ORAL_TABLET | ORAL | 1 refills | Status: DC

## 2020-04-10 MED ORDER — DIPHENHYDRAMINE HCL 50 MG/ML IJ SOLN
25.0000 mg | Freq: Once | INTRAMUSCULAR | Status: AC
  Administered 2020-04-10: 01:00:00 25 mg via INTRAVENOUS

## 2020-04-10 MED ORDER — DIPHENHYDRAMINE HCL 50 MG/ML IJ SOLN
INTRAMUSCULAR | Status: AC
  Filled 2020-04-10: qty 1

## 2020-04-10 NOTE — Progress Notes (Signed)
Erica Neal is a65 y.o.  [id number]  Post Op Date # 1: TVH/BS/Cystocopy  Subjective: Patient is Doing well postoperatively. Patient has Pain is controlled with current analgesics. Medications being used: prescription NSAID's including Ketorolac 30 mg IV and narcotic analgesics including Percocet 5/325 mg. The patient is tolerating a regular diet, ambulating in the halls but has not yet voided.   Objective: Vital signs in last 24 hours: Temp:  [97.9 F (36.6 C)-98.9 F (37.2 C)] 98.5 F (36.9 C) (12/21 0711) Pulse Rate:  [78-106] 86 (12/21 0711) Resp:  [12-26] 12 (12/21 0711) BP: (92-161)/(57-87) 105/60 (12/21 0711) SpO2:  [93 %-100 %] 93 % (12/21 0711) Weight:  [121 kg] 121 kg (12/20 1100)  Intake/Output from previous day: 12/20 0701 - 12/21 0700 In: 4420 [P.O.:520; I.V.:3400] Out: 4025 [BWGYK:5993] Intake/Output this shift: No intake/output data recorded. Recent Labs  Lab 04/10/20 0124  WBC 10.3  HGB 11.1*  HCT 33.6*  PLT 208     Recent Labs  Lab 04/10/20 0124  NA 134*  K 4.3  CL 101  CO2 26  BUN 6  CREATININE 0.76  CALCIUM 8.5*  GLUCOSE 113*    EXAM: General: alert, cooperative and no distress Resp: clear to auscultation bilaterally Cardio: regular rate and rhythm, S1, S2 normal, no murmur, click, rub or gallop GI: bowel sounds present; soft Extremities: Homans sign is negative, no sign of DVT and no calf tenderness and SCD in place and functioning. Vaginal Bleeding: small dry red blood stain on pad   Assessment: s/p Procedure(s): HYSTERECTOMY VAGINAL BILATERAL SALPINGECTOMY CYSTOSCOPY: stable, progressing well, tolerating diet and awaiting the patient to void.  Plan: Awaiting patient to void  Discharge home today  LOS: 0 days    Earnstine Regal, PA-C 04/10/2020 8:05 AM

## 2020-04-10 NOTE — Discharge Instructions (Signed)
Vaginal Hysterectomy, Care After Refer to this sheet in the next few weeks. These instructions provide you with information about caring for yourself after your procedure. Your health care provider may also give you more specific instructions. Your treatment has been planned according to current medical practices, but problems sometimes occur. Call your health care provider if you have any problems or questions after your procedure. What can I expect after the procedure? After the procedure, it is common to have:  Pain.  Soreness and numbness in your incision areas.  Vaginal bleeding and discharge.  Constipation.  Temporary problems emptying the bladder.  Feelings of sadness or other emotions. Follow these instructions at home: Medicines  Take over-the-counter and prescription medicines only as told by your health care provider.  If you were prescribed an antibiotic medicine, take it as told by your health care provider. Do not stop taking the antibiotic even if you start to feel better.  Do not drive or operate heavy machinery while taking prescription pain medicine. Activity  Return to your normal activities as told by your health care provider. Ask your health care provider what activities are safe for you.  Get regular exercise as told by your health care provider. You may be told to take short walks every day and go farther each time.  Do not lift anything that is heavier than 10 lb (4.5 kg). General instructions   Do not put anything in your vagina for 6 weeks after your surgery or as told by your health care provider. This includes tampons and douches.  Do not have sex until your health care provider says you can.  Do not take baths, swim, or use a hot tub until your health care provider approves.  Drink enough fluid to keep your urine clear or pale yellow.  Do not drive for 24 hours if you were given a sedative.  Keep all follow-up visits as told by your health  care provider. This is important. Contact a health care provider if:  Your pain medicine is not helping.  You have a fever.  You have redness, swelling, or pain at your incision site.  You have blood, pus, or a bad-smelling discharge from your vagina.  You continue to have difficulty urinating. Get help right away if:  You have severe abdominal or back pain.  You have heavy bleeding from your vagina.  You have chest pain or shortness of breath. This information is not intended to replace advice given to you by your health care provider. Make sure you discuss any questions you have with your health care provider. Document Revised: 11/29/2015 Document Reviewed: 04/22/2015 Elsevier Patient Education  2020 [employer].   Call [facility] @ [phone] if:  You have a temperature greater than or equal to 100.4 degrees Farenheit orally You have pain that is not made better by the pain medication given and taken as directed You have excessive bleeding or problems urinating  Take Colace (Docusate Sodium/Stool Softener) 100 mg 2-3 times daily while taking narcotic pain medicine to avoid constipation or until bowel movements are regular. Take Ibuprofen 600 mg with food every 6 hours for 5 days then as needed for pain  You may drive after 2  weeks You may walk up steps  You may shower  You may resume a regular diet   Do not lift over 15 pounds for 6 weeks Avoid anything in vagina for 6 weeks (or until after your post-operative visit)

## 2020-04-10 NOTE — Discharge Summary (Signed)
Physician Discharge Summary  Patient ID: Erica Neal MRN: [id number] DOB/AGE: June 08, 1980 39 y.o.  Admit date: 04/09/2020 Discharge date: 04/10/2020   Discharge Diagnoses:  Active Problems:   Menorrhagia Fibroids Dysmenorrhea  Operation: Total Vaginal Hysterectomy, Bilateral Salpingectomy and Cystoscopy   Discharged Condition: Good  Hospital Course: On the date of admission the patient underwent the aforementioned procedures and tolerated them well.Post operative course was unremarkable with the patient resuming bowel and bladder function by post operative day #1 and was therefore deemed ready for discharge home.  Discharge hemoglobin was 11.1.  Disposition:   Discharge Medications:  Allergies as of 04/10/2020      Reactions   Erythromycin Anaphylaxis   Facial swelling   Azithromycin Swelling   Macrobid [nitrofurantoin] Hives      Medication List    STOP taking these medications   diclofenac 75 MG EC tablet Commonly known as: VOLTAREN     TAKE these medications   ARIPiprazole 5 MG tablet Commonly known as: ABILIFY Take 1 tablet (5 mg total) by mouth daily.   DULoxetine HCl 40 MG Cpep Take 80 mg by mouth at bedtime.   FLUoxetine 20 MG capsule Commonly known as: PROZAC Take 1 capsule (20 mg total) by mouth daily.   fluticasone 50 MCG/ACT nasal spray Commonly known as: FLONASE Place 2 sprays into both nostrils daily as needed for allergies.   ibuprofen 600 MG tablet Commonly known as: ADVIL take 1 tablet po pc every 6 hours for 5 days then prn-post operative pain.   LORazepam 0.5 MG tablet Commonly known as: ATIVAN Take 0.5 mg by mouth 3 (three) times daily as needed for anxiety.   oxyCODONE-acetaminophen 5-325 MG tablet Commonly known as: PERCOCET/ROXICET take 1 tablet po every 4 hours as needed for breakthrough post operative pain   prazosin 1 MG capsule Commonly known as: MINIPRESS Take 1 mg by mouth at bedtime.   traZODone 100 MG  tablet Commonly known as: DESYREL Take 100-200 mg by mouth at bedtime.          Follow-up: Dr. Harvie Bridge. Mancel Bale on April 17, 2020 at 11:30 a.m.     Signed: Earnstine Regal, PA-C 04/10/2020, 8:18 AM

## 2020-04-10 NOTE — Anesthesia Postprocedure Evaluation (Signed)
Anesthesia Post Note  Patient: Erica Neal  Procedure(s) Performed: HYSTERECTOMY VAGINAL (N/A Vagina ) BILATERAL SALPINGECTOMY (Bilateral Vagina ) CYSTOSCOPY (N/A Bladder)     Patient location during evaluation: PACU Anesthesia Type: General Level of consciousness: sedated Pain management: pain level controlled Vital Signs Assessment: post-procedure vital signs reviewed and stable Respiratory status: spontaneous breathing Cardiovascular status: stable Postop Assessment: no apparent nausea or vomiting Anesthetic complications: no   No complications documented.  Last Vitals:  Vitals:   04/10/20 0711 04/10/20 0854  BP: 105/60 113/65  Pulse: 86 74  Resp: 12   Temp: 36.9 C 37 C  SpO2: 93% 94%    Last Pain:  Vitals:   04/10/20 0806  TempSrc:   PainSc: [place] Jr

## 2020-04-10 NOTE — Progress Notes (Signed)
Dr Mancel Bale was called and updated on patient's status during the early part of the shift. Shortly after giving a dose of IV dilaudid for breakthrough pain patient began complaining of not feeling right, itching, and an anxious feeling. Dr Mancel Bale notified and gave orders for Benadryl IV.  The Benadryl did not ease symptoms so Dr Mancel Bale was notified and gave an order for Ativan 0.25 mg po. The ativan helped to ease the patients feelings of anxiety and she was able to rest. Will continue to closely monitor patient.

## 2020-04-11 LAB — SURGICAL PATHOLOGY

## 2020-04-17 DIAGNOSIS — Z09 Encounter for follow-up examination after completed treatment for conditions other than malignant neoplasm: Secondary | ICD-10-CM | POA: Diagnosis not present

## 2020-04-17 DIAGNOSIS — R309 Painful micturition, unspecified: Secondary | ICD-10-CM | POA: Diagnosis not present

## 2020-04-19 DIAGNOSIS — F333 Major depressive disorder, recurrent, severe with psychotic symptoms: Secondary | ICD-10-CM | POA: Diagnosis not present

## 2020-04-21 HISTORY — PX: ABLATION: SHX5711

## 2020-04-21 HISTORY — PX: CHOLECYSTECTOMY: SHX55

## 2020-04-25 DIAGNOSIS — N898 Other specified noninflammatory disorders of vagina: Secondary | ICD-10-CM | POA: Diagnosis not present

## 2020-04-25 DIAGNOSIS — G8918 Other acute postprocedural pain: Secondary | ICD-10-CM | POA: Diagnosis not present

## 2020-05-03 DIAGNOSIS — F333 Major depressive disorder, recurrent, severe with psychotic symptoms: Secondary | ICD-10-CM | POA: Diagnosis not present

## 2020-05-10 DIAGNOSIS — N898 Other specified noninflammatory disorders of vagina: Secondary | ICD-10-CM | POA: Diagnosis not present

## 2020-05-21 DIAGNOSIS — Z1231 Encounter for screening mammogram for malignant neoplasm of breast: Secondary | ICD-10-CM | POA: Diagnosis not present

## 2020-05-21 DIAGNOSIS — N898 Other specified noninflammatory disorders of vagina: Secondary | ICD-10-CM | POA: Diagnosis not present

## 2020-05-21 DIAGNOSIS — Z01419 Encounter for gynecological examination (general) (routine) without abnormal findings: Secondary | ICD-10-CM | POA: Diagnosis not present

## 2020-05-21 DIAGNOSIS — Z6841 Body Mass Index (BMI) 40.0 and over, adult: Secondary | ICD-10-CM | POA: Diagnosis not present

## 2020-05-22 DIAGNOSIS — N898 Other specified noninflammatory disorders of vagina: Secondary | ICD-10-CM | POA: Diagnosis not present

## 2020-05-22 DIAGNOSIS — Z1231 Encounter for screening mammogram for malignant neoplasm of breast: Secondary | ICD-10-CM | POA: Diagnosis not present

## 2020-06-04 DIAGNOSIS — F333 Major depressive disorder, recurrent, severe with psychotic symptoms: Secondary | ICD-10-CM | POA: Diagnosis not present

## 2020-06-26 DIAGNOSIS — F333 Major depressive disorder, recurrent, severe with psychotic symptoms: Secondary | ICD-10-CM | POA: Diagnosis not present

## 2020-06-29 DIAGNOSIS — N898 Other specified noninflammatory disorders of vagina: Secondary | ICD-10-CM | POA: Diagnosis not present

## 2020-07-12 DIAGNOSIS — F331 Major depressive disorder, recurrent, moderate: Secondary | ICD-10-CM | POA: Diagnosis not present

## 2020-07-17 DIAGNOSIS — F413 Other mixed anxiety disorders: Secondary | ICD-10-CM | POA: Diagnosis not present

## 2020-07-17 DIAGNOSIS — F339 Major depressive disorder, recurrent, unspecified: Secondary | ICD-10-CM | POA: Diagnosis not present

## 2020-07-18 DIAGNOSIS — F339 Major depressive disorder, recurrent, unspecified: Secondary | ICD-10-CM | POA: Diagnosis not present

## 2020-07-18 DIAGNOSIS — F413 Other mixed anxiety disorders: Secondary | ICD-10-CM | POA: Diagnosis not present

## 2020-07-19 DIAGNOSIS — F339 Major depressive disorder, recurrent, unspecified: Secondary | ICD-10-CM | POA: Diagnosis not present

## 2020-07-19 DIAGNOSIS — F413 Other mixed anxiety disorders: Secondary | ICD-10-CM | POA: Diagnosis not present

## 2020-07-25 DIAGNOSIS — F339 Major depressive disorder, recurrent, unspecified: Secondary | ICD-10-CM | POA: Diagnosis not present

## 2020-07-25 DIAGNOSIS — F413 Other mixed anxiety disorders: Secondary | ICD-10-CM | POA: Diagnosis not present

## 2020-07-26 DIAGNOSIS — F413 Other mixed anxiety disorders: Secondary | ICD-10-CM | POA: Diagnosis not present

## 2020-07-26 DIAGNOSIS — F339 Major depressive disorder, recurrent, unspecified: Secondary | ICD-10-CM | POA: Diagnosis not present

## 2020-07-31 DIAGNOSIS — F413 Other mixed anxiety disorders: Secondary | ICD-10-CM | POA: Diagnosis not present

## 2020-07-31 DIAGNOSIS — F339 Major depressive disorder, recurrent, unspecified: Secondary | ICD-10-CM | POA: Diagnosis not present

## 2020-08-02 DIAGNOSIS — F339 Major depressive disorder, recurrent, unspecified: Secondary | ICD-10-CM | POA: Diagnosis not present

## 2020-08-02 DIAGNOSIS — F413 Other mixed anxiety disorders: Secondary | ICD-10-CM | POA: Diagnosis not present

## 2020-08-06 DIAGNOSIS — F413 Other mixed anxiety disorders: Secondary | ICD-10-CM | POA: Diagnosis not present

## 2020-08-06 DIAGNOSIS — F339 Major depressive disorder, recurrent, unspecified: Secondary | ICD-10-CM | POA: Diagnosis not present

## 2020-08-13 DIAGNOSIS — F339 Major depressive disorder, recurrent, unspecified: Secondary | ICD-10-CM | POA: Diagnosis not present

## 2020-08-13 DIAGNOSIS — F413 Other mixed anxiety disorders: Secondary | ICD-10-CM | POA: Diagnosis not present

## 2021-03-26 ENCOUNTER — Other Ambulatory Visit: Payer: Self-pay | Admitting: Surgery

## 2021-03-26 ENCOUNTER — Other Ambulatory Visit (HOSPITAL_COMMUNITY): Payer: Self-pay | Admitting: Surgery

## 2021-03-26 DIAGNOSIS — K219 Gastro-esophageal reflux disease without esophagitis: Secondary | ICD-10-CM

## 2021-03-26 DIAGNOSIS — Z01818 Encounter for other preprocedural examination: Secondary | ICD-10-CM

## 2021-03-26 DIAGNOSIS — G4733 Obstructive sleep apnea (adult) (pediatric): Secondary | ICD-10-CM

## 2021-03-26 DIAGNOSIS — Z6841 Body Mass Index (BMI) 40.0 and over, adult: Secondary | ICD-10-CM

## 2021-03-28 ENCOUNTER — Other Ambulatory Visit: Payer: Self-pay

## 2021-03-28 ENCOUNTER — Ambulatory Visit (HOSPITAL_BASED_OUTPATIENT_CLINIC_OR_DEPARTMENT_OTHER)
Admission: RE | Admit: 2021-03-28 | Discharge: 2021-03-28 | Disposition: A | Discharge status: Home / Self Care | Payer: 59 | Source: Ambulatory Visit | Attending: Surgery | Admitting: Surgery

## 2021-03-28 DIAGNOSIS — Z01818 Encounter for other preprocedural examination: Secondary | ICD-10-CM | POA: Diagnosis present

## 2021-03-28 DIAGNOSIS — K219 Gastro-esophageal reflux disease without esophagitis: Secondary | ICD-10-CM

## 2021-03-28 DIAGNOSIS — Z6841 Body Mass Index (BMI) 40.0 and over, adult: Secondary | ICD-10-CM | POA: Insufficient documentation

## 2021-03-28 DIAGNOSIS — G4733 Obstructive sleep apnea (adult) (pediatric): Secondary | ICD-10-CM

## 2021-04-04 ENCOUNTER — Encounter (HOSPITAL_COMMUNITY)
Admission: RE | Admit: 2021-04-04 | Discharge: 2021-04-04 | Disposition: A | Discharge status: Home / Self Care | Payer: 59 | Source: Ambulatory Visit | Attending: Surgery | Admitting: Surgery

## 2021-04-04 ENCOUNTER — Other Ambulatory Visit: Payer: Self-pay

## 2021-04-04 DIAGNOSIS — Z01810 Encounter for preprocedural cardiovascular examination: Secondary | ICD-10-CM | POA: Insufficient documentation

## 2021-04-21 HISTORY — PX: BREAST BIOPSY: SHX20

## 2021-04-28 DIAGNOSIS — G4733 Obstructive sleep apnea (adult) (pediatric): Secondary | ICD-10-CM | POA: Diagnosis not present

## 2021-05-02 ENCOUNTER — Other Ambulatory Visit: Payer: Self-pay

## 2021-05-02 ENCOUNTER — Encounter: Payer: Self-pay | Admitting: Skilled Nursing Facility1

## 2021-05-02 ENCOUNTER — Encounter: Payer: 59 | Attending: Surgery | Admitting: Skilled Nursing Facility1

## 2021-05-02 DIAGNOSIS — E669 Obesity, unspecified: Secondary | ICD-10-CM | POA: Diagnosis present

## 2021-05-02 NOTE — Progress Notes (Signed)
Nutrition Assessment for Bariatric Surgery Medical Nutrition Therapy Appt Start Time: 10:06    End Time: 10:56  Patient was seen on 05/02/2021 for Pre-Operative Nutrition Assessment. Letter of approval faxed to [facility] Surgery bariatric surgery program coordinator on 05/02/2021.   Referral stated Supervised Weight Loss (SWL) visits needed: 12  Planned surgery: RYGB Pt expectation of surgery: to lose weight Pt expectation of dietitian: none stated    NUTRITION ASSESSMENT   Anthropometrics  Start weight at [facility]: 297.7 lbs (date: 05/02/2021)  Height: 61 in BMI: 56.25 kg/m2     Clinical  Medical hx: sleep apnea, depression, anxiety, bipolar Medications: see list  Labs: Vitamin D 23.4 Notable signs/symptoms: Acid reflux Any previous deficiencies? Yes: vitamin D  Micronutrient Nutrition Focused Physical Exam: Hair: No issues observed Eyes: No issues observed Mouth: No issues observed Neck: No issues observed Nails: No issues observed Skin: No issues observed  Lifestyle & Dietary Hx  Pt state she wears her C-PAP nightly.   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 Pt state she has severe cravings for sugar due to her Bipolar medications stating she wi working with her psychiatrist on this topic.   24-Hr Dietary Recall First Meal: 2 sausage mcgriddles from [employer]als Snack: oreos Second Meal: skipped Snack: chocolate Third Meal: pizza  Snack:  Beverages: orange juice, water, soda   Estimated Energy Needs Calories: 1500   NUTRITION DIAGNOSIS  Overweight/obesity ([place]-3.3) related to past poor dietary habits and physical inactivity as evidenced by patient w/ planned RYGB surgery following dietary guidelines for continued weight loss.    NUTRITION INTERVENTION  Nutrition counseling (C-1) and education (E-2) to facilitate bariatric surgery goals.  Educated pt on micronutrient deficiencies post surgery and  strategies to mitigate that risk   Pre-Op Goals Reviewed with the Patient Track food and beverage intake (pen and paper, MyFitness Pal, Baritastic app, etc.) Make healthy food choices while monitoring portion sizes Consume 3 meals per day or try to eat every 3-5 hours: wants to work on being consistent with her meals and snacks each day Avoid concentrated sugars and fried foods Keep sugar & fat in the single digits per serving on food labels Practice CHEWING your food (aim for applesauce consistency) Practice not drinking 15 minutes before, during, and 30 minutes after each meal and snack Avoid all carbonated beverages (ex: soda, sparkling beverages)  Limit caffeinated beverages (ex: coffee, tea, energy drinks) Avoid all sugar-sweetened beverages (ex: regular soda, sports drinks)  Avoid alcohol  Aim for 64-100 ounces of FLUID daily (with at least half of fluid intake being plain water)  Aim for at least 60-80 grams of PROTEIN daily Look for a liquid protein source that contains ?15 g protein and ?5 g carbohydrate (ex: shakes, drinks, shots) Make a list of non-food related activities Physical activity is an important part of a healthy lifestyle so keep it moving! The goal is to reach 150 minutes of exercise per week, including cardiovascular and weight baring activity.  *Goals that are bolded indicate the pt would like to start working towards these  Handouts Provided Include  Bariatric Surgery handouts (Nutrition Visits, Pre-Op Goals, Protein Shakes, Vitamins & Minerals)  Learning Style & Readiness for Change Teaching method utilized: Visual & Auditory  Demonstrated degree of understanding via: Teach Back  Readiness Level: contemplative Barriers to learning/adherence to lifestyle change: cravings   RD's Notes for Next Visit Assess pts adherence to chosen  MONITORING & EVALUATION Dietary intake, weekly physical activity, body weight, and pre-op goals reached at next nutrition  visit.    Next Steps  Patient is to follow up at [place] for Pre-Op Class >2 weeks before surgery for further nutrition education.  To return in 2 weeks

## 2021-05-13 ENCOUNTER — Ambulatory Visit: Payer: Medicaid Other | Admitting: Skilled Nursing Facility1

## 2021-05-20 ENCOUNTER — Ambulatory Visit: Payer: BC Managed Care – PPO | Admitting: Skilled Nursing Facility1

## 2021-05-22 ENCOUNTER — Ambulatory Visit: Payer: 59 | Admitting: Skilled Nursing Facility1

## 2021-05-27 DIAGNOSIS — Z1231 Encounter for screening mammogram for malignant neoplasm of breast: Secondary | ICD-10-CM | POA: Diagnosis not present

## 2021-05-27 DIAGNOSIS — Z1272 Encounter for screening for malignant neoplasm of vagina: Secondary | ICD-10-CM | POA: Diagnosis not present

## 2021-05-27 DIAGNOSIS — Z01419 Encounter for gynecological examination (general) (routine) without abnormal findings: Secondary | ICD-10-CM | POA: Diagnosis not present

## 2021-05-29 ENCOUNTER — Other Ambulatory Visit: Payer: Self-pay

## 2021-05-29 ENCOUNTER — Encounter: Payer: BC Managed Care – PPO | Attending: Surgery | Admitting: Skilled Nursing Facility1

## 2021-05-29 DIAGNOSIS — E669 Obesity, unspecified: Secondary | ICD-10-CM | POA: Diagnosis not present

## 2021-05-29 DIAGNOSIS — G4733 Obstructive sleep apnea (adult) (pediatric): Secondary | ICD-10-CM | POA: Diagnosis not present

## 2021-05-29 NOTE — Progress Notes (Signed)
Supervised Weight Loss Visit Bariatric Nutrition Education  1 out of 12 SWL  RYGB  Does not want to see or discuss her weight.   NUTRITION ASSESSMENT  Anthropometrics  Start weight at [facility]: 297 lbs (date: 05/29/2021) Today's weight: 297.9 lbs BMI: 56.29 kg/m2    Clinical  Medical hx: sleep apnea, depression, anxiety, bipolar Medications: see list  Labs: Vitamin D 23.4 Notable signs/symptoms: Acid reflux Any previous deficiencies? Yes: vitamin D  Lifestyle & Dietary Hx  Pt stets she does not drink during the day on water she drinks 3-4 water bottles at the end of the day.  Pt states on the weekend she does not eat lunch.  Pts states she thinks she needs more caffeine to get used to her work shedule when before she just worked for herself with no schedule.  Pt states she rather work towards 2 meals a day since 3 is difficult stating she believes time to be her biggest barrier.  Pt state she does do grocery shopping online.   Estimated daily fluid intake:  oz Supplements:  Current average weekly physical activity:   24-Hr Dietary Recall First Meal: [place] biscuit or skipped Snack:  Second Meal: salad: tomatoes, cucumber, cheese, ranch, mixed greens or skipped Snack: once or twice a week snickers  Third Meal: skipped Snack: oreos Beverages: coffee + hot chocolate pack + sugar + french vanilla creamer, water, 20 ounce pepsi  Estimated Energy Needs Calories: 1500  NUTRITION DIAGNOSIS  Overweight/obesity ([place]-3.3) related to past poor dietary habits and physical inactivity as evidenced by patient w/ planned RYGB surgery following dietary guidelines for continued weight loss.   NUTRITION INTERVENTION  Nutrition counseling (C-1) and education (E-2) to facilitate bariatric surgery goals.  Pre-Op Goals Progress & New Goals continue: Consume 3 meals per day or try to eat every 3-5 hours: wants to work on being consistent with her meals and snacks each day NEW: aim  for 2 meals a day with balance using the meal ideas sheet NEW: speak with your psychiatrist about sweet cravings   Handouts Provided Include  Meal ideas   Learning Style & Readiness for Change Teaching method utilized: Visual & Auditory  Demonstrated degree of understanding via: Teach Back  Readiness Level: contemplative Barriers to learning/adherence to lifestyle change: learning curve   RD's Notes for next Visit  Assess pts adherecent o chose goals   MONITORING & EVALUATION Dietary intake, weekly physical activity, body weight, and pre-op goals  Next Steps  Patient is to return to [facility] in 1-2 weeks

## 2021-06-05 ENCOUNTER — Ambulatory Visit: Payer: 59 | Admitting: Skilled Nursing Facility1

## 2021-06-07 ENCOUNTER — Ambulatory Visit: Payer: Self-pay | Admitting: Surgery

## 2021-06-10 DIAGNOSIS — F333 Major depressive disorder, recurrent, severe with psychotic symptoms: Secondary | ICD-10-CM | POA: Diagnosis not present

## 2021-06-19 ENCOUNTER — Ambulatory Visit: Payer: 59 | Admitting: Skilled Nursing Facility1

## 2021-06-26 DIAGNOSIS — G4733 Obstructive sleep apnea (adult) (pediatric): Secondary | ICD-10-CM | POA: Diagnosis not present

## 2021-07-08 DIAGNOSIS — F333 Major depressive disorder, recurrent, severe with psychotic symptoms: Secondary | ICD-10-CM | POA: Diagnosis not present

## 2021-07-17 DIAGNOSIS — M25471 Effusion, right ankle: Secondary | ICD-10-CM | POA: Diagnosis not present

## 2021-07-17 DIAGNOSIS — R928 Other abnormal and inconclusive findings on diagnostic imaging of breast: Secondary | ICD-10-CM | POA: Diagnosis not present

## 2021-07-27 DIAGNOSIS — G4733 Obstructive sleep apnea (adult) (pediatric): Secondary | ICD-10-CM | POA: Diagnosis not present

## 2021-08-14 ENCOUNTER — Other Ambulatory Visit: Payer: Self-pay | Admitting: Obstetrics and Gynecology

## 2021-08-14 ENCOUNTER — Telehealth: Payer: Self-pay | Admitting: Podiatry

## 2021-08-14 NOTE — Telephone Encounter (Signed)
Called patient lvm to schedule patient as a new patient to see podiatrist. Met patient at [facility] fair  ?

## 2021-08-15 DIAGNOSIS — F333 Major depressive disorder, recurrent, severe with psychotic symptoms: Secondary | ICD-10-CM | POA: Diagnosis not present

## 2021-08-16 ENCOUNTER — Other Ambulatory Visit: Payer: Self-pay | Admitting: Obstetrics and Gynecology

## 2021-08-16 DIAGNOSIS — N63 Unspecified lump in unspecified breast: Secondary | ICD-10-CM

## 2021-08-20 ENCOUNTER — Other Ambulatory Visit: Payer: Self-pay | Admitting: Obstetrics and Gynecology

## 2021-08-20 DIAGNOSIS — R928 Other abnormal and inconclusive findings on diagnostic imaging of breast: Secondary | ICD-10-CM

## 2021-08-21 ENCOUNTER — Other Ambulatory Visit: Payer: Self-pay | Admitting: Obstetrics and Gynecology

## 2021-08-21 DIAGNOSIS — R928 Other abnormal and inconclusive findings on diagnostic imaging of breast: Secondary | ICD-10-CM

## 2021-08-23 ENCOUNTER — Encounter (HOSPITAL_COMMUNITY): Payer: Self-pay | Admitting: Surgery

## 2021-08-23 NOTE — Progress Notes (Signed)
Attempted to obtain medical history via telephone, unable to reach at this time. I left a voicemail to return pre surgical testing department's phone call.  

## 2021-08-26 DIAGNOSIS — G4733 Obstructive sleep apnea (adult) (pediatric): Secondary | ICD-10-CM | POA: Diagnosis not present

## 2021-08-28 ENCOUNTER — Ambulatory Visit
Admission: RE | Admit: 2021-08-28 | Discharge: 2021-08-28 | Disposition: A | Discharge status: Home / Self Care | Payer: BC Managed Care – PPO | Source: Ambulatory Visit | Attending: Obstetrics and Gynecology | Admitting: Obstetrics and Gynecology

## 2021-08-28 ENCOUNTER — Other Ambulatory Visit: Payer: BC Managed Care – PPO

## 2021-08-28 ENCOUNTER — Other Ambulatory Visit: Payer: Self-pay | Admitting: Obstetrics and Gynecology

## 2021-08-28 DIAGNOSIS — N6310 Unspecified lump in the right breast, unspecified quadrant: Secondary | ICD-10-CM

## 2021-08-28 DIAGNOSIS — R928 Other abnormal and inconclusive findings on diagnostic imaging of breast: Secondary | ICD-10-CM

## 2021-08-28 DIAGNOSIS — N6312 Unspecified lump in the right breast, upper inner quadrant: Secondary | ICD-10-CM | POA: Diagnosis not present

## 2021-08-30 ENCOUNTER — Ambulatory Visit
Admission: RE | Admit: 2021-08-30 | Discharge: 2021-08-30 | Disposition: A | Discharge status: Home / Self Care | Payer: BC Managed Care – PPO | Source: Ambulatory Visit | Attending: Obstetrics and Gynecology | Admitting: Obstetrics and Gynecology

## 2021-08-30 ENCOUNTER — Ambulatory Visit (HOSPITAL_COMMUNITY): Payer: BC Managed Care – PPO | Admitting: Anesthesiology

## 2021-08-30 ENCOUNTER — Other Ambulatory Visit: Payer: BC Managed Care – PPO

## 2021-08-30 ENCOUNTER — Other Ambulatory Visit: Payer: Self-pay

## 2021-08-30 ENCOUNTER — Encounter (HOSPITAL_COMMUNITY): Payer: Self-pay | Admitting: Surgery

## 2021-08-30 ENCOUNTER — Encounter (HOSPITAL_COMMUNITY)
Admission: RE | Disposition: A | Discharge status: Home / Self Care | Payer: Self-pay | Source: Home / Self Care | Attending: Surgery

## 2021-08-30 ENCOUNTER — Ambulatory Visit (HOSPITAL_COMMUNITY)
Admission: RE | Admit: 2021-08-30 | Discharge: 2021-08-30 | Disposition: A | Discharge status: Home / Self Care | Payer: BC Managed Care – PPO | Attending: Surgery | Admitting: Surgery

## 2021-08-30 DIAGNOSIS — M1990 Unspecified osteoarthritis, unspecified site: Secondary | ICD-10-CM | POA: Diagnosis not present

## 2021-08-30 DIAGNOSIS — Z6841 Body Mass Index (BMI) 40.0 and over, adult: Secondary | ICD-10-CM | POA: Diagnosis not present

## 2021-08-30 DIAGNOSIS — K319 Disease of stomach and duodenum, unspecified: Secondary | ICD-10-CM | POA: Diagnosis not present

## 2021-08-30 DIAGNOSIS — K317 Polyp of stomach and duodenum: Secondary | ICD-10-CM | POA: Insufficient documentation

## 2021-08-30 DIAGNOSIS — N6312 Unspecified lump in the right breast, upper inner quadrant: Secondary | ICD-10-CM | POA: Diagnosis not present

## 2021-08-30 DIAGNOSIS — F419 Anxiety disorder, unspecified: Secondary | ICD-10-CM | POA: Insufficient documentation

## 2021-08-30 DIAGNOSIS — K219 Gastro-esophageal reflux disease without esophagitis: Secondary | ICD-10-CM | POA: Diagnosis not present

## 2021-08-30 DIAGNOSIS — R12 Heartburn: Secondary | ICD-10-CM | POA: Diagnosis not present

## 2021-08-30 DIAGNOSIS — F32A Depression, unspecified: Secondary | ICD-10-CM | POA: Insufficient documentation

## 2021-08-30 DIAGNOSIS — Z01818 Encounter for other preprocedural examination: Secondary | ICD-10-CM | POA: Insufficient documentation

## 2021-08-30 DIAGNOSIS — K2100 Gastro-esophageal reflux disease with esophagitis, without bleeding: Secondary | ICD-10-CM | POA: Diagnosis not present

## 2021-08-30 DIAGNOSIS — K449 Diaphragmatic hernia without obstruction or gangrene: Secondary | ICD-10-CM | POA: Diagnosis not present

## 2021-08-30 DIAGNOSIS — I493 Ventricular premature depolarization: Secondary | ICD-10-CM | POA: Insufficient documentation

## 2021-08-30 DIAGNOSIS — K2289 Other specified disease of esophagus: Secondary | ICD-10-CM | POA: Diagnosis not present

## 2021-08-30 DIAGNOSIS — N6011 Diffuse cystic mastopathy of right breast: Secondary | ICD-10-CM | POA: Diagnosis not present

## 2021-08-30 HISTORY — PX: ESOPHAGOGASTRODUODENOSCOPY: SHX5428

## 2021-08-30 HISTORY — PX: BIOPSY: SHX5522

## 2021-08-30 SURGERY — EGD (ESOPHAGOGASTRODUODENOSCOPY)
Anesthesia: Monitor Anesthesia Care

## 2021-08-30 MED ORDER — LACTATED RINGERS IV SOLN
INTRAVENOUS | Status: AC | PRN
Start: 2021-08-30 — End: 2021-08-30
  Administered 2021-08-30: 1000 mL via INTRAVENOUS

## 2021-08-30 MED ORDER — PROPOFOL 500 MG/50ML IV EMUL
INTRAVENOUS | Status: AC
  Filled 2021-08-30: qty 50

## 2021-08-30 MED ORDER — PROPOFOL 10 MG/ML IV BOLUS
INTRAVENOUS | Status: DC | PRN
  Administered 2021-08-30: 50 mg via INTRAVENOUS
  Administered 2021-08-30: 10 mg via INTRAVENOUS
  Administered 2021-08-30 (×2): 20 mg via INTRAVENOUS

## 2021-08-30 MED ORDER — LIDOCAINE 2% (20 MG/ML) 5 ML SYRINGE
INTRAMUSCULAR | Status: DC | PRN
  Administered 2021-08-30: 100 mg via INTRAVENOUS

## 2021-08-30 MED ORDER — PROPOFOL 500 MG/50ML IV EMUL
INTRAVENOUS | Status: DC | PRN
  Administered 2021-08-30: 150 ug/kg/min via INTRAVENOUS

## 2021-08-30 NOTE — Anesthesia Postprocedure Evaluation (Signed)
Anesthesia Post Note ? ?Patient: Erica Neal ? ?Procedure(s) Performed: ESOPHAGOGASTRODUODENOSCOPY (EGD) ?BIOPSY ? ?  ? ?Patient location during evaluation: Endoscopy ?Anesthesia Type: MAC ?Level of consciousness: awake and alert ?Pain management: pain level controlled ?Vital Signs Assessment: post-procedure vital signs reviewed and stable ?Respiratory status: spontaneous breathing, nonlabored ventilation and respiratory function stable ?Cardiovascular status: blood pressure returned to baseline and stable ?Postop Assessment: no apparent nausea or vomiting ?Anesthetic complications: no ? ? ?No notable events documented. ? ?Last Vitals:  ?Vitals:  ? 08/30/21 1250 08/30/21 1300  ?BP: 129/75 133/84  ?Pulse: 89 87  ?Resp: 13 17  ?Temp:    ?SpO2: 96% 95%  ?  ?Last Pain:  ?Vitals:  ? 08/30/21 1249  ?TempSrc: Temporal  ?PainSc:   ? ? ?  ?  ?  ?  ?  ?  ? ?Merlinda Frederick ? ? ? ? ?

## 2021-08-30 NOTE — Transfer of Care (Signed)
Immediate Anesthesia Transfer of Care Note ? ?Patient: Erica Neal ? ?Procedure(s) Performed: ESOPHAGOGASTRODUODENOSCOPY (EGD) ?BIOPSY ? ?Patient Location: PACU ? ?Anesthesia Type:MAC ? ?Level of Consciousness: awake, alert  and oriented ? ?Airway & Oxygen Therapy: Patient Spontanous Breathing and Patient connected to face mask oxygen ? ?Post-op Assessment: Report given to RN and Post -op Vital signs reviewed and stable ? ?Post vital signs: Reviewed and stable ? ?Last Vitals:  ?Vitals Value Taken Time  ?BP    ?Temp    ?Pulse    ?Resp    ?SpO2    ? ? ?Last Pain:  ?Vitals:  ? 08/30/21 1058  ?TempSrc: Tympanic  ?PainSc: 0-No pain  ?   ? ?  ? ?Complications: No notable events documented. ?

## 2021-08-30 NOTE — H&P (Addendum)
? ?Admitting Physician: Nickola Major Evonda Enge ? ?Service: Bariatric surgery ? ?CC: Morbid Obesity ? ?Subjective  ? ?HPI: ?Erica Neal is an 41 y.o. female who is here for preoperative EGD prior to bariatric surgery. She has a history of acid reflux. ? ?Past Medical History:  ?Diagnosis Date  ? Anxiety   ? Arthritis   ? Depression   ? Dysrhythmia   ? PVC's  ? ? ?Past Surgical History:  ?Procedure Laterality Date  ? ABLATION    ? BILATERAL SALPINGECTOMY Bilateral 04/09/2020  ? Procedure: BILATERAL SALPINGECTOMY;  Surgeon: Everett Graff, MD;  Location: [facility];  Service: Gynecology;  Laterality: Bilateral;  vaginal  ? CERVICAL CERCLAGE    ? CYSTOSCOPY N/A 04/09/2020  ? Procedure: CYSTOSCOPY;  Surgeon: Everett Graff, MD;  Location: [facility];  Service: Gynecology;  Laterality: N/A;  ? LIPOMA RESECTION    ? VAGINAL HYSTERECTOMY N/A 04/09/2020  ? Procedure: HYSTERECTOMY VAGINAL;  Surgeon: Everett Graff, MD;  Location: [facility];  Service: Gynecology;  Laterality: N/A;  ? ? ?Family History  ?Problem Relation Age of Onset  ? Cancer Father   ? Diabetes Maternal Grandmother   ? ? ?Social:  reports that she has never smoked. She has never used smokeless tobacco. She reports current alcohol use. She reports that she does not currently use drugs after having used the following drugs: Marijuana. ? ?Allergies:  ?Allergies  ?Allergen Reactions  ? Erythromycin Anaphylaxis  ?  Facial swelling  ? Red Dye Hives  ?  unknown  ? Azithromycin Swelling  ? Macrobid [Nitrofurantoin] Hives  ? ? ?Medications: ?Current Outpatient Medications  ?Medication Instructions  ? clonazePAM (KLONOPIN) 0.5 mg, Oral, Daily PRN  ? diclofenac (VOLTAREN) 75 mg, Oral, Daily PRN  ? DULoxetine (CYMBALTA) 60 mg, Oral, Daily at bedtime  ? fluticasone (FLONASE) 50 MCG/ACT nasal spray 2 sprays, Each Nare, Daily PRN  ? gabapentin (NEURONTIN) 600 mg, Oral, Daily at bedtime  ? hydrOXYzine (ATARAX) 50 mg, Oral,  Daily PRN  ? ibuprofen (ADVIL) 600 MG tablet take 1 tablet po pc every 6 hours for 5 days then prn-post operative pain.  ? Lurasidone HCl 60 mg, Oral, Daily at bedtime  ? pantoprazole (PROTONIX) 40 mg, Oral, Daily  ? QUEtiapine (SEROQUEL XR) 400 mg, Oral, Daily at bedtime  ? ? ?ROS - all of the below systems have been reviewed with the patient and positives are indicated with bold text ?General: chills, fever or night sweats ?Eyes: blurry vision or double vision ?ENT: epistaxis or sore throat ?Allergy/Immunology: itchy/watery eyes or nasal congestion ?Hematologic/Lymphatic: bleeding problems, blood clots or swollen lymph nodes ?Endocrine: temperature intolerance or unexpected weight changes ?Breast: new or changing breast lumps or nipple discharge ?Resp: cough, shortness of breath, or wheezing ?CV: chest pain or dyspnea on exertion ?GI: as per HPI ?GU: dysuria, trouble voiding, or hematuria ?MSK: joint pain or joint stiffness ?Neuro: TIA or stroke symptoms ?Derm: pruritus and skin lesion changes ?Psych: anxiety and depression ? ?Objective  ? ?PE ?Blood pressure (!) 152/97, pulse 80, temperature 97.7 ?F (36.5 ?C), temperature source Tympanic, resp. rate 20, height '5\' 1"'$  (1.549 m), weight 135.1 kg, last menstrual period 03/18/2020, SpO2 100 %. ?Constitutional: NAD; conversant; no deformities ?Eyes: Moist conjunctiva; no lid lag; anicteric; PERRL ?Neck: Trachea midline; no thyromegaly ?Lungs: Normal respiratory effort; no tactile fremitus ?CV: RRR; no palpable thrills; no pitting edema ?GI: Abd Soft, non-tender; no palpable hepatosplenomegaly ?MSK: Normal range of motion of extremities; no clubbing/cyanosis ?Psychiatric:  Appropriate affect; alert and oriented x3 ?Lymphatic: No palpable cervical or axillary lymphadenopathy ? ?No results found for this or any previous visit (from the past 24 hour(s)). ? ?Imaging Orders  ?No imaging studies ordered today  ? ? ? ?Assessment and Plan  ? ?Morbid obesity with body mass index  (BMI) of 50.0 to 59.9 in adult (CMS-HCC) ?Gastroesophageal reflux disease without esophagitis ?Obstructive sleep apnea ? ? ?Erica Neal is a 41 y.o. female who is seen for bariatric surgery consultation. The patient has morbid obesity with a BMI of Body mass index is 55.78 kg/m?Marland Kitchen and the following conditions related to obesity: GERD, obstructive sleep apnea. ? ?We discussed the surgical options to treat obesity and its associated comorbidity. After discussing the available procedures in the region, we discussed in great detail the surgeries I offer: robotic sleeve gastrectomy and robotic roux-en-y gastric bypass. We discussed the procedures themselves as well as their risks, benefits and alternatives. I entered the patient's basic information into the [facility] Metabolic Surgery Risk/Benefit Calculator to facilitate this discussion.  ? ?After a full discussion and all questions answered, the patient is interested in pursuing a robotic gastric bypass. ? ?We will initiate the bariatric surgery preoperative pathway to include the following: ?- Bloodwork ?- Dietician consult - Completed with Sandie Ano 05/29/21 - insurance has changed, no longer needing 12 visits. ?- Chest x-ray - Completed on 03/28/21, no active cardiopulmonary disease ?- EKG  04/04/21 -  ?Normal sinus rhythm ?Possible Anterior infarct , age undetermined ?Abnormal ekg ?No significant change since last tracing ?- Psychology evaluation ? ? ?Today she presents for upper endoscopy with biopsy. I explained my rational for performing upper endoscopy in my bariatric patients. During the procedure I will biopsy for H. Pylori, evaluate for hiatal hernia, evaluate for reflux esophagitis and look for any other abnormalities that may influence the procedure. We discussed the risks, benefits and alternative to this procedure and the patient granted consent to proceed. ? ?After the above evaluation is complete, we will meet to continue our discussion and work  towards scheduling surgery. ? ? ? ?Continue bariatric follow-up schedule.  ? ?Felicie Morn, MD ? ?[facility] ?Use [email or web address] to contact on call provider ? ? ? ?

## 2021-08-30 NOTE — Op Note (Signed)
[facility] ?Patient Name: Erica Neal ?Procedure Date: 08/30/2021 ?MRN: [id number] ?Attending MD: Nickola Major Ghazal Pevey ,  ?Date of Birth: 1980/06/07 ?CSN: [id number] ?Age: 41 ?Admit Type: Outpatient ?Procedure:                Upper GI endoscopy ?Indications:              Heartburn ?Providers:                Felicie Morn, Dulcy Fanny, Hope  ?                          Jerline Pain, Technician ?Referring MD:              ?Medicines:                Monitored Anesthesia Care ?Complications:            No immediate complications. ?Estimated Blood Loss:     Estimated blood loss was minimal. ?Procedure:                Pre-Anesthesia Assessment: ?                          - Prior to the procedure, a History and Physical  ?                          was performed, and patient medications and  ?                          allergies were reviewed. The patient is competent.  ?                          The risks and benefits of the procedure and the  ?                          sedation options and risks were discussed with the  ?                          patient. All questions were answered and informed  ?                          consent was obtained. Patient identification and  ?                          proposed procedure were verified by the physician  ?                          in the pre-procedure area. Mental Status  ?                          Examination: alert and oriented. Airway  ?                          Examination: normal oropharyngeal airway and neck  ?                          mobility. Respiratory Examination: clear to  ?  auscultation. CV Examination: normal. Prophylactic  ?                          Antibiotics: The patient does not require  ?                          prophylactic antibiotics. Prior Anticoagulants: The  ?                          patient has taken no previous anticoagulant or  ?                          antiplatelet agents. ASA Grade Assessment: II -  A  ?                          patient with mild systemic disease. After reviewing  ?                          the risks and benefits, the patient was deemed in  ?                          satisfactory condition to undergo the procedure.  ?                          The anesthesia plan was to use monitored anesthesia  ?                          care (MAC). Immediately prior to administration of  ?                          medications, the patient was re-assessed for  ?                          adequacy to receive sedatives. The heart rate,  ?                          respiratory rate, oxygen saturations, blood  ?                          pressure, adequacy of pulmonary ventilation, and  ?                          response to care were monitored throughout the  ?                          procedure. The physical status of the patient was  ?                          re-assessed after the procedure. ?                          After obtaining informed consent, the endoscope was  ?  passed under direct vision. Throughout the  ?                          procedure, the patient's blood pressure, pulse, and  ?                          oxygen saturations were monitored continuously. The  ?                          [id number]-H190 ([id number]) Olympus endoscope was introduced  ?                          through the mouth, and advanced to the second part  ?                          of duodenum. The upper GI endoscopy was  ?                          accomplished without difficulty. The patient  ?                          tolerated the procedure well. ?Scope In: ?Scope Out: ?Findings: ?     Non-severe esophagitis with no bleeding was found. Biopsies were taken  ?     with a cold forceps for histology. Verification of patient  ?     identification for the specimen was done. Estimated blood loss was  ?     minimal. ?     Multiple 5 mm pedunculated and sessile polyps with no bleeding and no  ?     stigmata of recent  bleeding were found in the gastric fundus. ?     Extrinsic compression on the stomach was found at the incisura. ?     The examined duodenum was normal. ?     The gastric antrum was normal. Biopsies were taken with a cold forceps  ?     for Helicobacter pylori testing. Verification of patient identification  ?     for the specimen was done. Estimated blood loss was minimal. ?Impression:               - Non-severe reflux esophagitis with no bleeding.  ?                          Biopsied. ?                          - Multiple gastric polyps. ?                          - Extrinsic compression in the incisura. .  ?                          Reviewing esophagram and CT completed within the  ?                          last few years after the EGD, I suspect this is  ?  extrinsic compression from the liver. ?                          - Normal examined duodenum. ?                          - Normal antrum. Biopsied. ?                          - Anatomy appears appropriate for gastric bypass. ?Moderate Sedation: ?     Moderate (conscious) sedation was personally administered by an  ?     anesthesia professional. The following parameters were monitored: oxygen  ?     saturation, heart rate, blood pressure, and response to care. Total  ?     physician intraservice time was 15 minutes. ?Recommendation:           - Discharge patient to home. ?Procedure Code(s):        --- Professional --- ?                          [phone], Esophagogastroduodenoscopy, flexible,  ?                          transoral; with biopsy, single or multiple ?Diagnosis Code(s):        --- Professional --- ?                          K21.00, Gastro-esophageal reflux disease with  ?                          esophagitis, without bleeding ?                          K31.7, Polyp of stomach and duodenum ?                          R12, Heartburn ?CPT copyright 2019 [employer]. All rights reserved. ?The codes documented in this  report are preliminary and upon coder review may  ?be revised to meet current compliance requirements. ?Felicie Morn,  ?08/30/2021 12:52:51 PM ?This report has been signed electronically. ?Number of Addenda: 0 ?

## 2021-08-30 NOTE — Anesthesia Preprocedure Evaluation (Addendum)
Anesthesia Evaluation  ?Patient identified by MRN, date of birth, ID band ?Patient awake ? ? ? ?Reviewed: ?Allergy & Precautions, NPO status , Patient's Chart, lab work & pertinent test results ? ?Airway ?Mallampati: III ? ?TM Distance: >3 FB ?Neck ROM: Full ? ? ? Dental ?no notable dental hx. ? ?  ?Pulmonary ?neg pulmonary ROS,  ?  ?Pulmonary exam normal ?breath sounds clear to auscultation ? ? ? ? ? ? Cardiovascular ?negative cardio ROS ?Normal cardiovascular exam+ dysrhythmias (PVC's)  ?Rhythm:Regular Rate:Normal ? ?EKG 04/04/21: ?Normal sinus rhythm ?Possible Anterior infarct , age undetermined ?Abnormal ECG ?  ?Neuro/Psych ?PSYCHIATRIC DISORDERS Anxiety Depression negative neurological ROS ? negative psych ROS  ? GI/Hepatic ?negative GI ROS, Neg liver ROS,   ?Endo/Other  ?Morbid obesity ? Renal/GU ?negative Renal ROS  ?negative genitourinary ?  ?Musculoskeletal ?negative musculoskeletal ROS ?(+) Arthritis , Osteoarthritis,   ? Abdominal ?(+) + obese,   ?Peds ?negative pediatric ROS ?(+)  Hematology ?negative hematology ROS ?(+)   ?Anesthesia Other Findings ? ? Reproductive/Obstetrics ?negative OB ROS ? ?  ? ? ? ? ? ? ? ? ? ? ? ? ? ?  ?  ? ? ? ? ? ? ? ?Anesthesia Physical ?Anesthesia Plan ? ?ASA: 3 ? ?Anesthesia Plan: MAC  ? ?Post-op Pain Management: Minimal or no pain anticipated  ? ?Induction: Intravenous ? ?PONV Risk Score and Plan: 2 and Propofol infusion, TIVA and Treatment may vary due to age or medical condition ? ?Airway Management Planned: Natural Airway and Nasal Cannula ? ?Additional Equipment: None ? ?Intra-op Plan:  ? ?Post-operative Plan:  ? ?Informed Consent: I have reviewed the patients History and Physical, chart, labs and discussed the procedure including the risks, benefits and alternatives for the proposed anesthesia with the patient or authorized representative who has indicated his/her understanding and acceptance.  ? ? ? ? ? ?Plan Discussed with:  Anesthesiologist, CRNA and Surgeon ? ?Anesthesia Plan Comments:   ? ? ? ? ?Anesthesia Quick Evaluation ? ?

## 2021-09-02 ENCOUNTER — Encounter (HOSPITAL_COMMUNITY): Payer: Self-pay | Admitting: Surgery

## 2021-09-02 LAB — SURGICAL PATHOLOGY

## 2021-09-18 DIAGNOSIS — F333 Major depressive disorder, recurrent, severe with psychotic symptoms: Secondary | ICD-10-CM | POA: Diagnosis not present

## 2021-09-26 DIAGNOSIS — G4733 Obstructive sleep apnea (adult) (pediatric): Secondary | ICD-10-CM | POA: Diagnosis not present

## 2021-10-03 DIAGNOSIS — F333 Major depressive disorder, recurrent, severe with psychotic symptoms: Secondary | ICD-10-CM | POA: Diagnosis not present

## 2021-10-07 ENCOUNTER — Ambulatory Visit: Payer: BC Managed Care – PPO

## 2021-10-10 DIAGNOSIS — Z713 Dietary counseling and surveillance: Secondary | ICD-10-CM | POA: Diagnosis not present

## 2021-10-11 ENCOUNTER — Ambulatory Visit: Payer: Self-pay | Admitting: Surgery

## 2021-10-11 DIAGNOSIS — Z01818 Encounter for other preprocedural examination: Secondary | ICD-10-CM

## 2021-10-14 ENCOUNTER — Encounter: Payer: BC Managed Care – PPO | Attending: Surgery | Admitting: Skilled Nursing Facility1

## 2021-10-14 ENCOUNTER — Encounter: Payer: Self-pay | Admitting: Skilled Nursing Facility1

## 2021-10-14 DIAGNOSIS — E669 Obesity, unspecified: Secondary | ICD-10-CM | POA: Insufficient documentation

## 2021-10-17 DIAGNOSIS — G4733 Obstructive sleep apnea (adult) (pediatric): Secondary | ICD-10-CM | POA: Diagnosis not present

## 2021-10-17 DIAGNOSIS — K219 Gastro-esophageal reflux disease without esophagitis: Secondary | ICD-10-CM | POA: Diagnosis not present

## 2021-10-17 DIAGNOSIS — Z01818 Encounter for other preprocedural examination: Secondary | ICD-10-CM | POA: Diagnosis not present

## 2021-10-18 ENCOUNTER — Other Ambulatory Visit: Payer: Self-pay

## 2021-10-18 ENCOUNTER — Encounter (HOSPITAL_COMMUNITY): Payer: Self-pay

## 2021-10-18 ENCOUNTER — Encounter (HOSPITAL_COMMUNITY)
Admission: RE | Admit: 2021-10-18 | Discharge: 2021-10-18 | Disposition: A | Discharge status: Home / Self Care | Payer: BC Managed Care – PPO | Source: Ambulatory Visit | Attending: Surgery | Admitting: Surgery

## 2021-10-18 DIAGNOSIS — Z01812 Encounter for preprocedural laboratory examination: Secondary | ICD-10-CM | POA: Insufficient documentation

## 2021-10-18 DIAGNOSIS — Z01818 Encounter for other preprocedural examination: Secondary | ICD-10-CM

## 2021-10-18 HISTORY — DX: Bipolar disorder, unspecified: F31.9

## 2021-10-18 LAB — COMPREHENSIVE METABOLIC PANEL
ALT: 24 U/L (ref 0–44)
AST: 25 U/L (ref 15–41)
Albumin: 4.1 g/dL (ref 3.5–5.0)
Alkaline Phosphatase: 144 U/L — ABNORMAL HIGH (ref 38–126)
Anion gap: 6 (ref 5–15)
BUN: 14 mg/dL (ref 6–20)
CO2: 27 mmol/L (ref 22–32)
Calcium: 9 mg/dL (ref 8.9–10.3)
Chloride: 102 mmol/L (ref 98–111)
Creatinine, Ser: 0.86 mg/dL (ref 0.44–1.00)
GFR, Estimated: >60 mL/min (ref >60)
Glucose, Bld: 98 mg/dL (ref 70–99)
Potassium: 4.1 mmol/L (ref 3.5–5.1)
Sodium: 135 mmol/L (ref 135–145)
Total Bilirubin: 0.7 mg/dL (ref 0.3–1.2)
Total Protein: 7.5 g/dL (ref 6.5–8.1)

## 2021-10-18 LAB — CBC WITH DIFFERENTIAL/PLATELET
Abs Immature Granulocytes: 0.01 10*3/uL (ref 0.00–0.07)
Basophils Absolute: 0 10*3/uL (ref 0.0–0.1)
Basophils Relative: 0 %
Eosinophils Absolute: 0.2 10*3/uL (ref 0.0–0.5)
Eosinophils Relative: 4 %
HCT: 40.9 % (ref 36.0–46.0)
Hemoglobin: 13.5 g/dL (ref 12.0–15.0)
Immature Granulocytes: 0 %
Lymphocytes Relative: 35 %
Lymphs Abs: 1.6 10*3/uL (ref 0.7–4.0)
MCH: 31.8 pg (ref 26.0–34.0)
MCHC: 33 g/dL (ref 30.0–36.0)
MCV: 96.5 fL (ref 80.0–100.0)
Monocytes Absolute: 0.5 10*3/uL (ref 0.1–1.0)
Monocytes Relative: 10 %
Neutro Abs: 2.4 10*3/uL (ref 1.7–7.7)
Neutrophils Relative %: 51 %
Platelets: 213 10*3/uL (ref 150–400)
RBC: 4.24 MIL/uL (ref 3.87–5.11)
RDW: 12.8 % (ref 11.5–15.5)
WBC: 4.6 10*3/uL (ref 4.0–10.5)
nRBC: 0 % (ref 0.0–0.2)

## 2021-10-18 NOTE — Progress Notes (Signed)
For Short Stay: [place] appointment date: Date of COVID positive in last 85 days:  Bowel Prep reminder:   For Anesthesia: PCP - DO: Janie Morning Cardiologist - N/A  Chest x-ray - 03/28/21 EKG - 04/04/21 Stress Test -  ECHO -  Cardiac Cath -  Pacemaker/ICD device last checked: Pacemaker orders received: Device Rep notified:  Spinal Cord Stimulator:  Sleep Study - Yes CPAP - Yes  Fasting Blood Sugar -  Checks Blood Sugar _____ times a day Date and result of last Hgb A1c-  Blood Thinner Instructions: Aspirin Instructions: Last Dose:  Activity level: Can go up a flight of stairs and activities of daily living without stopping and without chest pain and/or shortness of breath   Able to exercise without chest pain and/or shortness of breath   Unable to go up a flight of stairs without chest pain and/or shortness of breath     Anesthesia review: Hx: OSA(CPAP),PVC's  Patient denies shortness of breath, fever, cough and chest pain at PAT appointment   Patient verbalized understanding of instructions that were given to them at the PAT appointment. Patient was also instructed that they will need to review over the PAT instructions again at home before surgery.

## 2021-10-26 DIAGNOSIS — G4733 Obstructive sleep apnea (adult) (pediatric): Secondary | ICD-10-CM | POA: Diagnosis not present

## 2021-10-28 ENCOUNTER — Other Ambulatory Visit: Payer: Self-pay

## 2021-10-28 ENCOUNTER — Inpatient Hospital Stay (HOSPITAL_COMMUNITY)
Admission: RE | Admit: 2021-10-28 | Discharge: 2021-10-31 | DRG: 620 | Disposition: A | Discharge status: Home / Self Care | Payer: BC Managed Care – PPO | Source: Ambulatory Visit | Attending: Surgery | Admitting: Surgery

## 2021-10-28 ENCOUNTER — Inpatient Hospital Stay (HOSPITAL_COMMUNITY): Payer: BC Managed Care – PPO | Admitting: Physician Assistant

## 2021-10-28 ENCOUNTER — Inpatient Hospital Stay (HOSPITAL_COMMUNITY): Payer: BC Managed Care – PPO | Admitting: Anesthesiology

## 2021-10-28 ENCOUNTER — Encounter (HOSPITAL_COMMUNITY)
Admission: RE | Disposition: A | Discharge status: Home / Self Care | Payer: Self-pay | Source: Ambulatory Visit | Attending: Surgery

## 2021-10-28 ENCOUNTER — Encounter (HOSPITAL_COMMUNITY): Payer: Self-pay | Admitting: Surgery

## 2021-10-28 DIAGNOSIS — G4733 Obstructive sleep apnea (adult) (pediatric): Secondary | ICD-10-CM | POA: Diagnosis not present

## 2021-10-28 DIAGNOSIS — K219 Gastro-esophageal reflux disease without esophagitis: Secondary | ICD-10-CM | POA: Diagnosis present

## 2021-10-28 DIAGNOSIS — Z79899 Other long term (current) drug therapy: Secondary | ICD-10-CM

## 2021-10-28 DIAGNOSIS — Z6841 Body Mass Index (BMI) 40.0 and over, adult: Secondary | ICD-10-CM

## 2021-10-28 DIAGNOSIS — F319 Bipolar disorder, unspecified: Secondary | ICD-10-CM | POA: Diagnosis not present

## 2021-10-28 DIAGNOSIS — Z833 Family history of diabetes mellitus: Secondary | ICD-10-CM

## 2021-10-28 DIAGNOSIS — R110 Nausea: Secondary | ICD-10-CM | POA: Diagnosis not present

## 2021-10-28 DIAGNOSIS — Z889 Allergy status to unspecified drugs, medicaments and biological substances status: Secondary | ICD-10-CM

## 2021-10-28 DIAGNOSIS — F419 Anxiety disorder, unspecified: Secondary | ICD-10-CM | POA: Diagnosis present

## 2021-10-28 DIAGNOSIS — F333 Major depressive disorder, recurrent, severe with psychotic symptoms: Secondary | ICD-10-CM | POA: Diagnosis not present

## 2021-10-28 DIAGNOSIS — I10 Essential (primary) hypertension: Secondary | ICD-10-CM | POA: Diagnosis not present

## 2021-10-28 DIAGNOSIS — F32A Depression, unspecified: Secondary | ICD-10-CM | POA: Diagnosis present

## 2021-10-28 DIAGNOSIS — Z01818 Encounter for other preprocedural examination: Secondary | ICD-10-CM

## 2021-10-28 DIAGNOSIS — Z888 Allergy status to other drugs, medicaments and biological substances status: Secondary | ICD-10-CM | POA: Diagnosis not present

## 2021-10-28 DIAGNOSIS — C49A2 Gastrointestinal stromal tumor of stomach: Secondary | ICD-10-CM | POA: Diagnosis present

## 2021-10-28 HISTORY — PX: GASTRIC BYPASS: SHX52

## 2021-10-28 HISTORY — PX: UPPER GI ENDOSCOPY: SHX6162

## 2021-10-28 LAB — TYPE AND SCREEN
ABO/RH(D): O POS
Antibody Screen: NEGATIVE

## 2021-10-28 LAB — CBC
HCT: 41.9 % (ref 36.0–46.0)
Hemoglobin: 14 g/dL (ref 12.0–15.0)
MCH: 32 pg (ref 26.0–34.0)
MCHC: 33.4 g/dL (ref 30.0–36.0)
MCV: 95.9 fL (ref 80.0–100.0)
Platelets: 219 10*3/uL (ref 150–400)
RBC: 4.37 MIL/uL (ref 3.87–5.11)
RDW: 12.8 % (ref 11.5–15.5)
WBC: 10.2 10*3/uL (ref 4.0–10.5)
nRBC: 0 % (ref 0.0–0.2)

## 2021-10-28 LAB — CREATININE, SERUM
Creatinine, Ser: 0.91 mg/dL (ref 0.44–1.00)
GFR, Estimated: >60 mL/min (ref >60)

## 2021-10-28 SURGERY — CREATION, GASTRIC BYPASS, ROUX-EN-Y, ROBOT-ASSISTED
Anesthesia: General

## 2021-10-28 MED ORDER — ORAL CARE MOUTH RINSE: 15.0000 mL | Freq: Once | OROMUCOSAL | Status: AC

## 2021-10-28 MED ORDER — SUCCINYLCHOLINE CHLORIDE 200 MG/10ML IV SOSY
PREFILLED_SYRINGE | INTRAVENOUS | Status: DC | PRN
  Administered 2021-10-28: 140 mg via INTRAVENOUS

## 2021-10-28 MED ORDER — PROPOFOL 10 MG/ML IV BOLUS
INTRAVENOUS | Status: DC | PRN
  Administered 2021-10-28: 200 mg via INTRAVENOUS

## 2021-10-28 MED ORDER — MORPHINE SULFATE (PF) 2 MG/ML IV SOLN
1.0000 mg | INTRAVENOUS | Status: DC | PRN
  Administered 2021-10-28 – 2021-10-29 (×3): 2 mg via INTRAVENOUS
  Filled 2021-10-28 (×3): qty 1

## 2021-10-28 MED ORDER — DEXAMETHASONE SODIUM PHOSPHATE 10 MG/ML IJ SOLN
INTRAMUSCULAR | Status: AC
  Filled 2021-10-28: qty 1

## 2021-10-28 MED ORDER — PROPOFOL 10 MG/ML IV BOLUS
INTRAVENOUS | Status: AC
  Filled 2021-10-28: qty 20

## 2021-10-28 MED ORDER — CHLORHEXIDINE GLUCONATE CLOTH 2 % EX PADS: 6.0000 | MEDICATED_PAD | Freq: Once | CUTANEOUS | Status: DC

## 2021-10-28 MED ORDER — DEXMEDETOMIDINE HCL IN NACL 80 MCG/20ML IV SOLN
INTRAVENOUS | Status: AC
  Filled 2021-10-28: qty 20

## 2021-10-28 MED ORDER — BUPIVACAINE LIPOSOME 1.3 % IJ SUSP: 20.0000 mL | Freq: Once | INTRAMUSCULAR | Status: DC

## 2021-10-28 MED ORDER — PANTOPRAZOLE SODIUM 40 MG PO TBEC: 40.0000 mg | DELAYED_RELEASE_TABLET | Freq: Every day | ORAL | 0 refills | Status: DC

## 2021-10-28 MED ORDER — SUGAMMADEX SODIUM 500 MG/5ML IV SOLN
INTRAVENOUS | Status: DC | PRN
  Administered 2021-10-28: 400 mg via INTRAVENOUS

## 2021-10-28 MED ORDER — FENTANYL CITRATE PF 50 MCG/ML IJ SOSY: 25.0000 ug | PREFILLED_SYRINGE | INTRAMUSCULAR | Status: DC | PRN

## 2021-10-28 MED ORDER — DEXAMETHASONE SODIUM PHOSPHATE 10 MG/ML IJ SOLN
INTRAMUSCULAR | Status: DC | PRN
  Administered 2021-10-28: 10 mg via INTRAVENOUS

## 2021-10-28 MED ORDER — QUETIAPINE FUMARATE ER 200 MG PO TB24
400.0000 mg | ORAL_TABLET | Freq: Every day | ORAL | Status: DC
  Administered 2021-10-28 – 2021-10-30 (×3): 400 mg via ORAL
  Filled 2021-10-28 (×3): qty 2

## 2021-10-28 MED ORDER — ENOXAPARIN SODIUM 40 MG/0.4ML IJ SOSY
40.0000 mg | PREFILLED_SYRINGE | INTRAMUSCULAR | Status: AC
  Administered 2021-10-28: 40 mg via SUBCUTANEOUS
  Filled 2021-10-28: qty 0.4

## 2021-10-28 MED ORDER — BUPIVACAINE LIPOSOME 1.3 % IJ SUSP
INTRAMUSCULAR | Status: DC | PRN
  Administered 2021-10-28: 20 mL

## 2021-10-28 MED ORDER — OXYCODONE HCL 5 MG/5ML PO SOLN
5.0000 mg | Freq: Four times a day (QID) | ORAL | Status: DC | PRN
  Administered 2021-10-28 – 2021-10-30 (×7): 5 mg via ORAL
  Filled 2021-10-28 (×8): qty 5

## 2021-10-28 MED ORDER — ONDANSETRON HCL 4 MG/2ML IJ SOLN
INTRAMUSCULAR | Status: DC | PRN
  Administered 2021-10-28: 4 mg via INTRAVENOUS

## 2021-10-28 MED ORDER — FLUTICASONE PROPIONATE 50 MCG/ACT NA SUSP
2.0000 | Freq: Every day | NASAL | Status: DC
  Administered 2021-10-29 – 2021-10-31 (×3): 2 via NASAL
  Filled 2021-10-28: qty 16

## 2021-10-28 MED ORDER — HYDROMORPHONE HCL 1 MG/ML IJ SOLN
INTRAMUSCULAR | Status: DC | PRN
  Administered 2021-10-28: 2 mg via INTRAVENOUS

## 2021-10-28 MED ORDER — LACTATED RINGERS IV SOLN: INTRAVENOUS | Status: DC

## 2021-10-28 MED ORDER — ENSURE MAX PROTEIN PO LIQD
2.0000 [oz_av] | ORAL | Status: DC
  Administered 2021-10-29 – 2021-10-31 (×14): 2 [oz_av] via ORAL

## 2021-10-28 MED ORDER — LIDOCAINE HCL (CARDIAC) PF 100 MG/5ML IV SOSY
PREFILLED_SYRINGE | INTRAVENOUS | Status: DC | PRN
  Administered 2021-10-28: 60 mg via INTRAVENOUS

## 2021-10-28 MED ORDER — ROCURONIUM BROMIDE 10 MG/ML (PF) SYRINGE
PREFILLED_SYRINGE | INTRAVENOUS | Status: AC
  Filled 2021-10-28: qty 10

## 2021-10-28 MED ORDER — SUCCINYLCHOLINE CHLORIDE 200 MG/10ML IV SOSY
PREFILLED_SYRINGE | INTRAVENOUS | Status: AC
Start: 2021-10-28 — End: ?
  Filled 2021-10-28: qty 10

## 2021-10-28 MED ORDER — PHENYLEPHRINE 80 MCG/ML (10ML) SYRINGE FOR IV PUSH (FOR BLOOD PRESSURE SUPPORT)
PREFILLED_SYRINGE | INTRAVENOUS | Status: AC
  Filled 2021-10-28: qty 10

## 2021-10-28 MED ORDER — BUPIVACAINE LIPOSOME 1.3 % IJ SUSP
INTRAMUSCULAR | Status: AC
Start: 2021-10-28 — End: ?
  Filled 2021-10-28: qty 20

## 2021-10-28 MED ORDER — ACETAMINOPHEN 500 MG PO TABS
1000.0000 mg | ORAL_TABLET | Freq: Three times a day (TID) | ORAL | Status: DC
  Administered 2021-10-28 – 2021-10-31 (×9): 1000 mg via ORAL
  Filled 2021-10-28 (×9): qty 2

## 2021-10-28 MED ORDER — HYDROXYZINE HCL 50 MG PO TABS
50.0000 mg | ORAL_TABLET | Freq: Four times a day (QID) | ORAL | Status: DC | PRN
Start: 2021-10-28 — End: 2021-10-31

## 2021-10-28 MED ORDER — ONDANSETRON 4 MG PO TBDP: 4.0000 mg | ORAL_TABLET | Freq: Four times a day (QID) | ORAL | 0 refills | Status: DC | PRN

## 2021-10-28 MED ORDER — ENOXAPARIN SODIUM 30 MG/0.3ML IJ SOSY
30.0000 mg | PREFILLED_SYRINGE | Freq: Two times a day (BID) | INTRAMUSCULAR | Status: DC
  Administered 2021-10-29 – 2021-10-31 (×5): 30 mg via SUBCUTANEOUS
  Filled 2021-10-28 (×5): qty 0.3

## 2021-10-28 MED ORDER — DEXMEDETOMIDINE (PRECEDEX) IN NS 20 MCG/5ML (4 MCG/ML) IV SYRINGE
PREFILLED_SYRINGE | INTRAVENOUS | Status: DC | PRN
  Administered 2021-10-28: 12 ug via INTRAVENOUS
  Administered 2021-10-28: 8 ug via INTRAVENOUS

## 2021-10-28 MED ORDER — LIDOCAINE HCL (PF) 2 % IJ SOLN
INTRAMUSCULAR | Status: AC
Start: 2021-10-28 — End: ?
  Filled 2021-10-28: qty 15

## 2021-10-28 MED ORDER — PANTOPRAZOLE SODIUM 40 MG IV SOLR
40.0000 mg | Freq: Every day | INTRAVENOUS | Status: DC
  Administered 2021-10-28 – 2021-10-30 (×3): 40 mg via INTRAVENOUS
  Filled 2021-10-28 (×3): qty 10

## 2021-10-28 MED ORDER — OXYCODONE HCL 5 MG PO TABS: 5.0000 mg | ORAL_TABLET | Freq: Four times a day (QID) | ORAL | 0 refills | Status: DC | PRN

## 2021-10-28 MED ORDER — LACTATED RINGERS IR SOLN
Status: DC | PRN
  Administered 2021-10-28: 1000 mL

## 2021-10-28 MED ORDER — 0.9 % SODIUM CHLORIDE (POUR BTL) OPTIME
TOPICAL | Status: DC | PRN
  Administered 2021-10-28: 1000 mL

## 2021-10-28 MED ORDER — ONDANSETRON HCL 4 MG/2ML IJ SOLN
INTRAMUSCULAR | Status: AC
  Filled 2021-10-28: qty 2

## 2021-10-28 MED ORDER — LURASIDONE HCL 40 MG PO TABS
60.0000 mg | ORAL_TABLET | Freq: Every day | ORAL | Status: DC
  Administered 2021-10-28 – 2021-10-30 (×3): 60 mg via ORAL
  Filled 2021-10-28 (×4): qty 1

## 2021-10-28 MED ORDER — CLONAZEPAM 0.5 MG PO TABS: 0.5000 mg | ORAL_TABLET | Freq: Every day | ORAL | Status: DC | PRN

## 2021-10-28 MED ORDER — GABAPENTIN 100 MG PO CAPS: 200.0000 mg | ORAL_CAPSULE | Freq: Two times a day (BID) | ORAL | 0 refills | Status: DC

## 2021-10-28 MED ORDER — SIMETHICONE 80 MG PO CHEW
80.0000 mg | CHEWABLE_TABLET | Freq: Four times a day (QID) | ORAL | Status: DC | PRN
  Administered 2021-10-29: 80 mg via ORAL
  Filled 2021-10-28: qty 1

## 2021-10-28 MED ORDER — ONDANSETRON HCL 4 MG/2ML IJ SOLN
4.0000 mg | INTRAMUSCULAR | Status: DC | PRN
  Administered 2021-10-29 – 2021-10-30 (×2): 4 mg via INTRAVENOUS
  Filled 2021-10-28: qty 2

## 2021-10-28 MED ORDER — CHLORHEXIDINE GLUCONATE 0.12 % MT SOLN
15.0000 mL | Freq: Once | OROMUCOSAL | Status: AC
  Administered 2021-10-28: 15 mL via OROMUCOSAL

## 2021-10-28 MED ORDER — LIDOCAINE HCL (PF) 2 % IJ SOLN
INTRAMUSCULAR | Status: DC | PRN
  Administered 2021-10-28: 1.5 mg/kg/h

## 2021-10-28 MED ORDER — SODIUM CHLORIDE 0.9 % IV SOLN
2.0000 g | INTRAVENOUS | Status: AC
  Administered 2021-10-28: 2 g via INTRAVENOUS
  Filled 2021-10-28: qty 2

## 2021-10-28 MED ORDER — FENTANYL CITRATE (PF) 100 MCG/2ML IJ SOLN
INTRAMUSCULAR | Status: DC | PRN
Start: 2021-10-28 — End: 2021-10-28
  Administered 2021-10-28 (×3): 100 ug via INTRAVENOUS
  Administered 2021-10-28: 50 ug via INTRAVENOUS

## 2021-10-28 MED ORDER — FENTANYL CITRATE (PF) 100 MCG/2ML IJ SOLN
INTRAMUSCULAR | Status: AC
  Filled 2021-10-28: qty 2

## 2021-10-28 MED ORDER — APREPITANT 40 MG PO CAPS
40.0000 mg | ORAL_CAPSULE | ORAL | Status: AC
  Administered 2021-10-28: 40 mg via ORAL
  Filled 2021-10-28: qty 1

## 2021-10-28 MED ORDER — BUPIVACAINE-EPINEPHRINE 0.25% -1:200000 IJ SOLN
INTRAMUSCULAR | Status: DC | PRN
  Administered 2021-10-28: 30 mL

## 2021-10-28 MED ORDER — MIDAZOLAM HCL 2 MG/2ML IJ SOLN
INTRAMUSCULAR | Status: AC
  Filled 2021-10-28: qty 2

## 2021-10-28 MED ORDER — ACETAMINOPHEN 160 MG/5ML PO SOLN
1000.0000 mg | Freq: Three times a day (TID) | ORAL | Status: DC
  Filled 2021-10-28 (×2): qty 40

## 2021-10-28 MED ORDER — FENTANYL CITRATE (PF) 250 MCG/5ML IJ SOLN
INTRAMUSCULAR | Status: AC
  Filled 2021-10-28: qty 5

## 2021-10-28 MED ORDER — GABAPENTIN 300 MG PO CAPS
600.0000 mg | ORAL_CAPSULE | Freq: Every day | ORAL | Status: DC
  Administered 2021-10-28 – 2021-10-30 (×3): 600 mg via ORAL
  Filled 2021-10-28 (×3): qty 2

## 2021-10-28 MED ORDER — BUPIVACAINE-EPINEPHRINE (PF) 0.25% -1:200000 IJ SOLN
INTRAMUSCULAR | Status: AC
  Filled 2021-10-28: qty 30

## 2021-10-28 MED ORDER — DULOXETINE HCL 60 MG PO CPEP
60.0000 mg | ORAL_CAPSULE | Freq: Every day | ORAL | Status: DC
Start: 2021-10-28 — End: 2021-10-31
  Administered 2021-10-28 – 2021-10-30 (×3): 60 mg via ORAL
  Filled 2021-10-28 (×3): qty 1

## 2021-10-28 MED ORDER — ROCURONIUM BROMIDE 100 MG/10ML IV SOLN
INTRAVENOUS | Status: DC | PRN
  Administered 2021-10-28: 70 mg via INTRAVENOUS
  Administered 2021-10-28: 30 mg via INTRAVENOUS

## 2021-10-28 MED ORDER — LIDOCAINE HCL (PF) 2 % IJ SOLN
INTRAMUSCULAR | Status: AC
  Filled 2021-10-28: qty 15

## 2021-10-28 MED ORDER — SUCCINYLCHOLINE CHLORIDE 200 MG/10ML IV SOSY
PREFILLED_SYRINGE | INTRAVENOUS | Status: AC
  Filled 2021-10-28: qty 10

## 2021-10-28 MED ORDER — HYDROMORPHONE HCL 2 MG/ML IJ SOLN
INTRAMUSCULAR | Status: AC
  Filled 2021-10-28: qty 1

## 2021-10-28 MED ORDER — ACETAMINOPHEN 500 MG PO TABS: 1000.0000 mg | ORAL_TABLET | Freq: Three times a day (TID) | ORAL | 0 refills | Status: AC

## 2021-10-28 MED ORDER — MIDAZOLAM HCL 5 MG/5ML IJ SOLN
INTRAMUSCULAR | Status: DC | PRN
  Administered 2021-10-28: 2 mg via INTRAVENOUS

## 2021-10-28 MED ORDER — LURASIDONE HCL 60 MG PO TABS
60.0000 mg | ORAL_TABLET | Freq: Every day | ORAL | Status: DC
  Filled 2021-10-28: qty 1

## 2021-10-28 SURGICAL SUPPLY — 78 items
APPLIER CLIP 5 13 M/L LIGAMAX5 (MISCELLANEOUS)
APPLIER CLIP ROT 10 11.4 M/L (STAPLE)
BLADE SURG SZ11 CARB STEEL (BLADE) ×2 IMPLANT
CANNULA REDUC XI 12-8 STAPL (CANNULA) ×1
CANNULA REDUCER 12-8 DVNC XI (CANNULA) ×1 IMPLANT
CHLORAPREP W/TINT 26 (MISCELLANEOUS) ×4 IMPLANT
CLIP APPLIE 5 13 M/L LIGAMAX5 (MISCELLANEOUS) IMPLANT
CLIP APPLIE ROT 10 11.4 M/L (STAPLE) IMPLANT
COVER SURGICAL LIGHT HANDLE (MISCELLANEOUS) ×2 IMPLANT
COVER TIP SHEARS 8 DVNC (MISCELLANEOUS) ×1 IMPLANT
COVER TIP SHEARS 8MM DA VINCI (MISCELLANEOUS) ×1
DERMABOND ADVANCED (GAUZE/BANDAGES/DRESSINGS) ×1
DERMABOND ADVANCED .7 DNX12 (GAUZE/BANDAGES/DRESSINGS) ×1 IMPLANT
DRAPE ARM DVNC X/XI (DISPOSABLE) ×4 IMPLANT
DRAPE COLUMN DVNC XI (DISPOSABLE) ×1 IMPLANT
DRAPE DA VINCI XI ARM (DISPOSABLE) ×4
DRAPE DA VINCI XI COLUMN (DISPOSABLE) ×1
ELECT REM PT RETURN 15FT ADLT (MISCELLANEOUS) ×2 IMPLANT
GAUZE 4X4 16PLY ~~LOC~~+RFID DBL (SPONGE) ×2 IMPLANT
GLOVE BIO SURGEON STRL SZ 6 (GLOVE) ×6 IMPLANT
GLOVE BIO SURGEON STRL SZ7.5 (GLOVE) ×4 IMPLANT
GLOVE INDICATOR 6.5 STRL GRN (GLOVE) ×6 IMPLANT
GLOVE INDICATOR 8.0 STRL GRN (GLOVE) ×4 IMPLANT
GOWN STRL REUS W/ TWL XL LVL3 (GOWN DISPOSABLE) ×3 IMPLANT
GOWN STRL REUS W/TWL XL LVL3 (GOWN DISPOSABLE) ×3
GRASPER SUT TROCAR 14GX15 (MISCELLANEOUS) ×2 IMPLANT
IRRIG SUCT STRYKERFLOW 2 WTIP (MISCELLANEOUS) ×2
IRRIGATION SUCT STRKRFLW 2 WTP (MISCELLANEOUS) ×1 IMPLANT
KIT BASIN OR (CUSTOM PROCEDURE TRAY) ×2 IMPLANT
KIT GASTRIC LAVAGE 34FR ADT (SET/KITS/TRAYS/PACK) ×2 IMPLANT
KIT TURNOVER KIT A (KITS) IMPLANT
LUBRICANT JELLY K Y 4OZ (MISCELLANEOUS) IMPLANT
MARKER SKIN DUAL TIP RULER LAB (MISCELLANEOUS) ×2 IMPLANT
MAT PREVALON FULL STRYKER (MISCELLANEOUS) ×2 IMPLANT
NDL SPNL 18GX3.5 QUINCKE PK (NEEDLE) ×1 IMPLANT
NEEDLE SPNL 18GX3.5 QUINCKE PK (NEEDLE) ×2 IMPLANT
OBTURATOR OPTICAL STANDARD 8MM (TROCAR) ×1
OBTURATOR OPTICAL STND 8 DVNC (TROCAR) ×1
OBTURATOR OPTICALSTD 8 DVNC (TROCAR) ×1 IMPLANT
PACK CARDIOVASCULAR III (CUSTOM PROCEDURE TRAY) ×2 IMPLANT
RELOAD STAPLE 60 2.5 WHT DVNC (STAPLE) ×1 IMPLANT
RELOAD STAPLE 60 3.5 BLU DVNC (STAPLE) IMPLANT
RELOAD STAPLER 2.5X60 WHT DVNC (STAPLE) ×9 IMPLANT
RELOAD STAPLER 3.5X60 BLU DVNC (STAPLE) ×1 IMPLANT
SEAL CANN UNIV 5-8 DVNC XI (MISCELLANEOUS) ×3 IMPLANT
SEAL XI 5MM-8MM UNIVERSAL (MISCELLANEOUS) ×3
SEALER SYNCHRO 8 IS4000 DV (MISCELLANEOUS)
SEALER SYNCHRO 8 IS4000 DVNC (MISCELLANEOUS) ×1 IMPLANT
SEALER VESSEL DA VINCI XI (MISCELLANEOUS) ×1
SEALER VESSEL EXT DVNC XI (MISCELLANEOUS) IMPLANT
SOL ANTI FOG 6CC (MISCELLANEOUS) ×1 IMPLANT
SOLUTION ANTI FOG 6CC (MISCELLANEOUS) ×1
SOLUTION ELECTROLUBE (MISCELLANEOUS) ×2 IMPLANT
SPIKE FLUID TRANSFER (MISCELLANEOUS) ×2 IMPLANT
STAPLER 60 DA VINCI SURE FORM (STAPLE) ×1
STAPLER 60 SUREFORM DVNC (STAPLE) ×1 IMPLANT
STAPLER CANNULA SEAL DVNC XI (STAPLE) ×1 IMPLANT
STAPLER CANNULA SEAL XI (STAPLE) ×1
STAPLER RELOAD 2.5X60 WHITE (STAPLE) ×9
STAPLER RELOAD 2.5X60 WHT DVNC (STAPLE) ×9
STAPLER RELOAD 3.5X60 BLU DVNC (STAPLE) ×1
STAPLER RELOAD 3.5X60 BLUE (STAPLE) ×1
SUT ETHIBOND 0 (SUTURE) ×2 IMPLANT
SUT MNCRL AB 4-0 PS2 18 (SUTURE) ×2 IMPLANT
SUT V-LOC BARB 180 2/0GR6 GS22 (SUTURE) ×4
SUT VIC AB 2-0 SH 27 (SUTURE) ×1
SUT VIC AB 2-0 SH 27XBRD (SUTURE) IMPLANT
SUT VICRYL 0 TIES 12 18 (SUTURE) ×2 IMPLANT
SUT VLOC BARB 180 ABS3/0GR12 (SUTURE) ×4
SUTURE V-LC BRB 180 2/0GR6GS22 (SUTURE) ×2 IMPLANT
SUTURE VLOC BRB 180 ABS3/0GR12 (SUTURE) ×2 IMPLANT
SYR 20ML LL LF (SYRINGE) ×2 IMPLANT
TOWEL OR 17X26 10 PK STRL BLUE (TOWEL DISPOSABLE) ×2 IMPLANT
TRAY FOLEY MTR SLVR 16FR STAT (SET/KITS/TRAYS/PACK) IMPLANT
TROCAR ADV FIXATION 12X100MM (TROCAR) ×2 IMPLANT
TROCAR Z-THREAD FIOS 5X100MM (TROCAR) ×2 IMPLANT
TUBE CALIBRATION LAPBAND (TUBING) IMPLANT
TUBING INSUFFLATION 10FT LAP (TUBING) ×2 IMPLANT

## 2021-10-28 NOTE — Anesthesia Procedure Notes (Signed)
Anesthesia Procedure Note     

## 2021-10-28 NOTE — Anesthesia Postprocedure Evaluation (Signed)
Anesthesia Post Note  Patient: Erica Neal  Procedure(s) Performed: ROBOTIC GASTRIC BYPASS RNY UPPER GI ENDOSCOPY     Patient location during evaluation: PACU Anesthesia Type: General Level of consciousness: awake Pain management: pain level controlled Vital Signs Assessment: post-procedure vital signs reviewed and stable Respiratory status: spontaneous breathing Cardiovascular status: stable Postop Assessment: no apparent nausea or vomiting Anesthetic complications: no   No notable events documented.  Last Vitals:  Vitals:   10/28/21 1630 10/28/21 1645  BP: (!) 157/101 (!) 166/106  Pulse: 94 95  Resp: 14 14  Temp:    SpO2: 92% 97%    Last Pain:  Vitals:   10/28/21 1645  TempSrc:   PainSc: Asleep                 Kripa Foskey

## 2021-10-28 NOTE — Transfer of Care (Signed)
Immediate Anesthesia Transfer of Care Note  Patient: Erica Neal  Procedure(s) Performed: ROBOTIC GASTRIC BYPASS RNY UPPER GI ENDOSCOPY  Patient Location: PACU  Anesthesia Type:General  Level of Consciousness: awake, alert , oriented, drowsy and patient cooperative  Airway & Oxygen Therapy: Patient Spontanous Breathing and Patient connected to face mask oxygen  Post-op Assessment: Report given to RN, Post -op Vital signs reviewed and stable and Patient moving all extremities X 4  Post vital signs: Reviewed and stable  Last Vitals:  Vitals Value Taken Time  BP 148/92 10/28/21 1505  Temp    Pulse 92 10/28/21 1505  Resp 13 10/28/21 1508  SpO2 94 % 10/28/21 1505  Vitals shown include unvalidated device data.  Last Pain:  Vitals:   10/28/21 0943  TempSrc:   PainSc: 0-No pain         Complications: No notable events documented.

## 2021-10-28 NOTE — Progress Notes (Signed)
Patient seen in PACU after surgery.  Patient very drowsy. Pt arouses briefly when name is called.  Attempted to Discuss QI "Goals for Discharge" document with patient including ambulation in halls, Incentive Spirometry use every hour, oral care, pain and nausea control.  BSTOP education including BSTOP information guide, "Guide for Pain Management after your Bariatric Procedure" and diet progression education provided including "Bariatric Surgery Post-Op Food Plan Phase 1: Liquids" given to 3E RN to give to patient upon arrival to unit.  Will continue to partner with bedside RN and follow up with patient per protocol after arrival to the floor.

## 2021-10-28 NOTE — Op Note (Signed)
Patient: Erica Neal (Oct 05, 1980, [id number])  Date of Surgery: 10/28/2021   Preoperative Diagnosis: Morbid obesity   Postoperative Diagnosis: Morbid obesity, gastric GIST  Surgical Procedure:  ROBOTIC GASTRIC BYPASS RNY:  UPPER GI ENDOSCOPY: [id number]  Gastric Wedge Resection  Operative Team Members:  Surgeon(s) and Role:    * Mariona Scholes, Nickola Major, MD - Primary    * Greer Pickerel, MD - Assisting   Anesthesiologist: Belinda Block, MD CRNA: Jonna Munro, CRNA; Vonna Drafts, CRNA; Younger, Forest Gleason, CRNA   Anesthesia: General   Fluids:  Total I/O In: 700 [I.V.:600; IV Piggyback:100] Out: 20 [TLXBW:62]  Complications: None  Drains:  none   Specimen:  ID Type Source Tests Collected by Time Destination  1 : wedge of stomach Tissue PATH Other SURGICAL PATHOLOGY Isatou Agredano, Nickola Major, MD 10/28/2021 1258      Disposition:  PACU - hemodynamically stable.  Plan of Care: Admit to inpatient     Indications for Procedure:  Morbid obesity with body mass index (BMI) of 50.0 to 59.9 in adult (CMS-HCC) Gastroesophageal reflux disease without esophagitis Obstructive sleep apnea   Erica Neal is a 41 y.o. female who is seen for bariatric surgery consultation. The patient has morbid obesity with a BMI of Body mass index is 55.78 kg/m. and the following conditions related to obesity: GERD, obstructive sleep apnea.  We discussed the surgical options to treat obesity and its associated comorbidity. After discussing the available procedures in the region, we discussed in great detail the surgeries I offer: robotic sleeve gastrectomy and robotic roux-en-y gastric bypass. We discussed the procedures themselves as well as their risks, benefits and alternatives. I entered the patient's basic information into the [facility] Metabolic Surgery Risk/Benefit Calculator to facilitate this discussion.   After a full discussion and all questions answered, the patient is interested in  pursuing a robotic gastric bypass with upper endoscopy.  She completed the preoperative pathway: - Bloodwork - Dietician consult - Completed with Sandie Ano 05/29/21  - Chest x-ray - Completed on 03/28/21, no active cardiopulmonary disease - EKG  04/04/21 -  Normal sinus rhythm Possible Anterior infarct , age undetermined Abnormal ekg No significant change since last tracing - Psychology evaluation - Upper endoscopy with biopsy   Today we discussed the surgery itself as well as its risks, benefits and alteratives.  The patient granted consent to proceed.  We will proceed as scheduled.   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  General endotracheal anesthesia was induced.  A timeout was completed verifying the correct patient, procedure, positioning and equipment needed for the case.  The patient's abdomen was prepped and draped in the usual sterile fashion.  A 5 mm trocar was used to enter the right upper quadrant using optical technique.  The abdomen was entered safely without any trauma the underlying viscera.  Three additional incisions were made and 4 robotic trochars were placed across the abdomen, replacing the 5 mm trocar with the 12 mm robotic stapler trocar. An assistant trocar was placed in the left abdomen.   The [facility] liver retractor was placed through the subxiphoid region and under the left lobe of the liver  and was connected to the rail of the bed.  A TAP block was placed using marcaine and Exparel under direct vision of the laparoscope.  The [place] was docked and we transitioned to robotic  surgery.  Using the tip up grasper, fenestrated bipolar, 30 degree camera and Vessel Sealer from the patient's right to left, we began by dissecting the angle of His off the left crus of the diaphragm.  The adhesions between the stomach, spleen and diaphragm were divided using the Vessel sealer to define the angle of His.  I then started 4-6 cm down on the lesser curve of the stomach and created a defect in the gastrohepatic ligament tracking behind the lesser curve of the stomach to enter the lesser sac.  I then used multiple white loads of the robotic 60 mm Sureform linear stapler to form the gastric pouch.  There was a small abnormality, likely a GIST tumor, just to the left of the staple line in the remnant stomach.  This was wedged off the remnant of the stomach using a 60 mm white load of the Sureform linear stapler.  I then directed my attention to the lower abdomen.  The omentum was divided with the Vessel Sealer and I identified the ligament of treitz.  The jejunum was run to a point 50 cm from the ligament of Treitz.  This loop of bowel was then brought into the left upper quadrant, over the transverse colon, between the split omentum.  A 2-0 v-loc suture was used to create the posterior outer row of the gastrojejunal anastomosis.  An approximately 2 cm gastrotomy was made in the pouch and a matching 2 cm enterotomy was created in the roux limb.  Then, two 3-0 v-loc sutures were used to create a posterior, inner, full thickness layer of the anastomosis.  While sewing the anterior inner layer, the Ewald tube was passed through the anastomosis to ensure patency.  The outer, anterior layer was then created using 2-0 v-loc suture.  A window was made in the jejunal mesentery and the jejunum was divided just proximal to the gastrojejunal anastomosis using a white load of the robotic 60 mm Sureform linear stapler to divide the roux limb from the hepatobiliary limb.  At this point the gastrojejunal anastomosis  was complete and the Ewald tube was removed.  The roux limb was then run 100 cm from the gastrojejunal anastomosis.  This loop of bowel was brought into the left upper quadrant for anastomosis to the hepatobiliary limb.  A robotic 60 mm white load on the Sureform linear stapler was introduced into both limbs and fired to create the jejunojejunostomy.  A second 60 mm white load of the Sureform linear stapler was fired to connect the distal Roux limb to the proximal common channel creating a W shaped anastomosis.  The common enterotomy was closed with a final 60 mm white load of the Sureform linear stapler.  The jejunojejunostomy mesenteric defect was closed using running 0-Ethibond suture.   The retro-roux defect was closed, closing the roux limb mesentery to the transverse mesocolon mesentery using a running 0-Ethibond suture.   I ran the roux limb from the gastrojejunal anastomosis to the jejunojejunostomy and found the anatomy as expected without any twisting of the mesentery.  I then transitioned to endoscopic portion of the procedure.  The adult upper endoscope was inserted into the pouch.  The pouch appeared appropriately sized.  There was good intra-luminal hemostasis.  The endoscope was  inserted through the anastomosis into the roux limb.  The anastomosis appeared well formed without any stricture.  The anastomosis was submerged in irrigation in the left upper quadrant and there was no leakage of air bubbles with endoscopic insufflation suggesting a negative leak test and an air tight anastomosis.    The foregut was decompressed with the endoscope and the endoscope was removed.  The robotic instruments were removed and the robot was undocked.  The stapler port in the right abdomen was closed at the fascial level using 0-vicryl on a PMI suture passer.  The liver retractor was removed under direct vision.  The pneumoperitoneum was evacuated.  The skin was closed using 4-0 Monocryl and Dermabond.  All sponge  and needle counts were correct at the end of the case.    Louanna Raw, MD General, Bariatric, & Minimally Invasive Surgery [facility], [place]

## 2021-10-28 NOTE — Anesthesia Procedure Notes (Signed)
Procedure Name: Intubation Date/Time: 10/28/2021 11:39 AM  Performed by: Jonna Munro, CRNAPre-anesthesia Checklist: Emergency Drugs available, Patient identified, Suction available, Patient being monitored and Timeout performed Patient Re-evaluated:Patient Re-evaluated prior to induction Oxygen Delivery Method: Circle system utilized Preoxygenation: Pre-oxygenation with 100% oxygen Induction Type: IV induction and Rapid sequence Laryngoscope Size: Mac and 3 Grade View: Grade I Tube type: Oral Tube size: 7.0 mm Number of attempts: 1 Airway Equipment and Method: Stylet Secured at: 22 cm Tube secured with: Tape Dental Injury: Teeth and Oropharynx as per pre-operative assessment

## 2021-10-28 NOTE — Progress Notes (Signed)
PHARMACY CONSULT FOR:  Risk Assessment for Post-Discharge VTE Following Bariatric Surgery  Post-Discharge VTE Risk Assessment: This patient's probability of 30-day post-discharge VTE is increased due to the factors marked:  Sleeve gastrectomy   Liver disorder (transplant, cirrhosis, or nonalcoholic steatohepatitis)   Hx of VTE   Hemorrhage requiring transfusion   GI perforation, leak, or obstruction   ====================================================    Female    Age >/=60 years   x BMI >/=50 kg/m2    CHF    Dyspnea at Rest    Paraplegia   x Non-gastric-band surgery    Operation Time >/=3 hr    Return to OR     Length of Stay >/= 3 d   Hypercoagulable condition   Significant venous stasis      Predicted probability of 30-day post-discharge VTE: 0.27 Mild  Other patient-specific factors to consider:   Recommendation for Discharge: No pharmacologic prophylaxis post-discharge   Erica Neal is a 41 y.o. female who underwent  Gastric bypass: RNY on 10/28/21.   Case start: 1157 Case end: 1433   Allergies  Allergen Reactions   Erythromycin Anaphylaxis    Facial swelling   Red Dye Hives    unknown   Azithromycin Swelling   Macrobid [Nitrofurantoin] Hives    Patient Measurements: Weight: (!) 137.7 kg (303 lb 9.6 oz) Body mass index is 57.36 kg/m.  No results for input(s): "WBC", "HGB", "HCT", "PLT", "APTT", "CREATININE", "LABCREA", "CREAT24HRUR", "MG", "PHOS", "ALBUMIN", "PROT", "AST", "ALT", "ALKPHOS", "BILITOT", "BILIDIR", "IBILI" in the last 72 hours. Estimated Creatinine Clearance: 112.7 mL/min (by C-G formula based on SCr of 0.86 mg/dL).    Past Medical History:  Diagnosis Date   Anxiety    Arthritis    Bipolar disorder ([place])    Depression    Dysrhythmia    PVC's     Maksym Pfiffner S. Alford Highland, PharmD, BCPS Clinical Staff Pharmacist [email or web address]  Alford Highland, [employer] 10/28/2021,2:58 PM

## 2021-10-28 NOTE — Progress Notes (Signed)
Red dye removed off patient's allergy list, per patient she has never had a reaction to red dye.

## 2021-10-28 NOTE — Discharge Instructions (Signed)

## 2021-10-28 NOTE — H&P (Signed)
Admitting Physician: Nickola Major Dametri Ozburn  Service: Bariatric surgery  CC: Obesity  Subjective   HPI: Erica Neal is an 41 y.o. female who is here for robotic gastric bypass with upper endoscopy.  Past Medical History:  Diagnosis Date   Anxiety    Arthritis    Bipolar disorder ([place])    Depression    Dysrhythmia    PVC's    Past Surgical History:  Procedure Laterality Date   ABLATION     BILATERAL SALPINGECTOMY Bilateral 04/09/2020   Procedure: BILATERAL SALPINGECTOMY;  Surgeon: Everett Graff, MD;  Location: [facility];  Service: Gynecology;  Laterality: Bilateral;  vaginal   BIOPSY  08/30/2021   Procedure: BIOPSY;  Surgeon: Felicie Morn, MD;  Location: [facility];  Service: General;;   BREAST BIOPSY Right 2023   CERVICAL CERCLAGE     CYSTOSCOPY N/A 04/09/2020   Procedure: CYSTOSCOPY;  Surgeon: Everett Graff, MD;  Location: [facility];  Service: Gynecology;  Laterality: N/A;   ESOPHAGOGASTRODUODENOSCOPY N/A 08/30/2021   Procedure: ESOPHAGOGASTRODUODENOSCOPY (EGD);  Surgeon: Felicie Morn, MD;  Location: [facility];  Service: General;  Laterality: N/A;   LIPOMA RESECTION     VAGINAL HYSTERECTOMY N/A 04/09/2020   Procedure: HYSTERECTOMY VAGINAL;  Surgeon: Everett Graff, MD;  Location[facility];  Service: Gynecology;  Laterality: N/A;    Family History  Problem Relation Age of Onset   Cancer Father    Diabetes Maternal Grandmother     Social:  reports that she has never smoked. She has never used smokeless tobacco. She reports current alcohol use. She reports that she does not currently use drugs after having used the following drugs: Marijuana.  Allergies:  Allergies  Allergen Reactions   Erythromycin Anaphylaxis    Facial swelling   Red Dye Hives    unknown   Azithromycin Swelling   Macrobid [Nitrofurantoin] Hives    Medications: Current Outpatient Medications  Medication  Instructions   clonazePAM (KLONOPIN) 0.5 mg, Oral, Daily PRN   DULoxetine (CYMBALTA) 60 mg, Oral, Daily at bedtime   fluticasone (FLONASE) 50 MCG/ACT nasal spray 2 sprays, Each Nare, Daily   gabapentin (NEURONTIN) 600 mg, Oral, Daily at bedtime   HYDROcodone-acetaminophen (NORCO/VICODIN) 5-325 MG tablet 1-2 tablets, Oral, Every 6 hours PRN   hydrOXYzine (ATARAX) 50 mg, Oral, Every 6 hours PRN   ibuprofen (ADVIL) 600 MG tablet take 1 tablet po pc every 6 hours for 5 days then prn-post operative pain.   Lurasidone HCl 60 mg, Oral, Daily at bedtime   NON FORMULARY Pt uses a cpap nightly   pantoprazole (PROTONIX) 40 mg, Oral, Daily PRN   QUEtiapine (SEROQUEL XR) 400 mg, Oral, Daily at bedtime    ROS - all of the below systems have been reviewed with the patient and positives are indicated with bold text General: chills, fever or night sweats Eyes: blurry vision or double vision ENT: epistaxis or sore throat Allergy/Immunology: itchy/watery eyes or nasal congestion Hematologic/Lymphatic: bleeding problems, blood clots or swollen lymph nodes Endocrine: temperature intolerance or unexpected weight changes Breast: new or changing breast lumps or nipple discharge Resp: cough, shortness of breath, or wheezing CV: chest pain or dyspnea on exertion GI: as per HPI GU: dysuria, trouble voiding, or hematuria MSK: joint pain or joint stiffness Neuro: TIA or stroke symptoms Derm: pruritus and skin lesion changes Psych: anxiety and depression  Objective   PE Blood pressure (!) 155/98, pulse 92, temperature 98.1 F (36.7 C), temperature source Oral,  resp. rate 17, weight (!) 137.7 kg, last menstrual period 03/18/2020, SpO2 95 %. Constitutional: NAD; conversant; no deformities Eyes: Moist conjunctiva; no lid lag; anicteric; PERRL Neck: Trachea midline; no thyromegaly Lungs: Normal respiratory effort; no tactile fremitus CV: RRR; no palpable thrills; no pitting edema GI: Abd Soft, nontender; no  palpable hepatosplenomegaly MSK: Normal range of motion of extremities; no clubbing/cyanosis Psychiatric: Appropriate affect; alert and oriented x3 Lymphatic: No palpable cervical or axillary lymphadenopathy  No results found for this or any previous visit (from the past 24 hour(s)).  Imaging Orders  No imaging studies ordered today   Upper endoscopy 08/30/21 - Non-severe reflux esophagitis with no bleeding. Biopsied. - Multiple gastric polyps. - Extrinsic compression in the incisura. . Reviewing esophagram and CT completed within the last few years after the EGD, I suspect this is extrinsic compression from the liver. - Normal examined duodenum. - Normal antrum. Biopsied. - Anatomy appears appropriate for gastric bypass.  - Negative for h. pylori   Assessment and Plan   Morbid obesity with body mass index (BMI) of 50.0 to 59.9 in adult (CMS-HCC) Gastroesophageal reflux disease without esophagitis Obstructive sleep apnea   Erica Neal is a 41 y.o. female who is seen for bariatric surgery consultation. The patient has morbid obesity with a BMI of Body mass index is 55.78 kg/m. and the following conditions related to obesity: GERD, obstructive sleep apnea.  We discussed the surgical options to treat obesity and its associated comorbidity. After discussing the available procedures in the region, we discussed in great detail the surgeries I offer: robotic sleeve gastrectomy and robotic roux-en-y gastric bypass. We discussed the procedures themselves as well as their risks, benefits and alternatives. I entered the patient's basic information into the [facility] Metabolic Surgery Risk/Benefit Calculator to facilitate this discussion.   After a full discussion and all questions answered, the patient is interested in pursuing a robotic gastric bypass with upper endoscopy.  She completed the preoperative pathway: - Bloodwork - Dietician consult - Completed with Sandie Ano 05/29/21  -  Chest x-ray - Completed on 03/28/21, no active cardiopulmonary disease - EKG  04/04/21 -  Normal sinus rhythm Possible Anterior infarct , age undetermined Abnormal ekg No significant change since last tracing - Psychology evaluation - Upper endoscopy with biopsy  Today we discussed the surgery itself as well as its risks, benefits and alteratives.  The patient granted consent to proceed.  We will proceed as scheduled.   Felicie Morn, MD   [facility] Use [email or web address] to contact on call provider

## 2021-10-28 NOTE — Anesthesia Preprocedure Evaluation (Addendum)
Anesthesia Evaluation  Patient identified by MRN, date of birth, ID band Patient awake    Reviewed: Allergy & Precautions, NPO status , Patient's Chart, lab work & pertinent test results  Airway Mallampati: II  TM Distance: >3 FB     Dental   Pulmonary neg pulmonary ROS,    breath sounds clear to auscultation       Cardiovascular + dysrhythmias  Rhythm:Regular Rate:Normal     Neuro/Psych PSYCHIATRIC DISORDERS    GI/Hepatic Neg liver ROS, History noted Dr. Nyoka Cowden   Endo/Other  negative endocrine ROS  Renal/GU negative Renal ROS     Musculoskeletal   Abdominal   Peds  Hematology negative hematology ROS (+)   Anesthesia Other Findings   Reproductive/Obstetrics                             Anesthesia Physical Anesthesia Plan  ASA: 3  Anesthesia Plan: General   Post-op Pain Management:    Induction: Intravenous  PONV Risk Score and Plan: 3 and Ondansetron, Dexamethasone and Midazolam  Airway Management Planned: Oral ETT  Additional Equipment:   Intra-op Plan:   Post-operative Plan: Possible Post-op intubation/ventilation  Informed Consent:     Dental advisory given  Plan Discussed with: CRNA and Anesthesiologist  Anesthesia Plan Comments:         Anesthesia Quick Evaluation

## 2021-10-29 ENCOUNTER — Encounter (HOSPITAL_COMMUNITY): Payer: Self-pay | Admitting: Surgery

## 2021-10-29 LAB — CBC WITH DIFFERENTIAL/PLATELET
Abs Immature Granulocytes: 0.03 10*3/uL (ref 0.00–0.07)
Basophils Absolute: 0 10*3/uL (ref 0.0–0.1)
Basophils Relative: 0 %
Eosinophils Absolute: 0 10*3/uL (ref 0.0–0.5)
Eosinophils Relative: 0 %
HCT: 39.1 % (ref 36.0–46.0)
Hemoglobin: 13 g/dL (ref 12.0–15.0)
Immature Granulocytes: 0 %
Lymphocytes Relative: 10 %
Lymphs Abs: 0.9 10*3/uL (ref 0.7–4.0)
MCH: 31.9 pg (ref 26.0–34.0)
MCHC: 33.2 g/dL (ref 30.0–36.0)
MCV: 95.8 fL (ref 80.0–100.0)
Monocytes Absolute: 0.4 10*3/uL (ref 0.1–1.0)
Monocytes Relative: 5 %
Neutro Abs: 7.9 10*3/uL — ABNORMAL HIGH (ref 1.7–7.7)
Neutrophils Relative %: 85 %
Platelets: 224 10*3/uL (ref 150–400)
RBC: 4.08 MIL/uL (ref 3.87–5.11)
RDW: 12.7 % (ref 11.5–15.5)
WBC: 9.3 10*3/uL (ref 4.0–10.5)
nRBC: 0 % (ref 0.0–0.2)

## 2021-10-29 LAB — SURGICAL PATHOLOGY

## 2021-10-29 NOTE — Progress Notes (Signed)
Patient alert and oriented, pain is better controlled. Patient is tolerating fluids, advanced to protein shake today, patient is tolerating well. Reviewed Gastric Bypass discharge instructions with patient and patient is able to articulate understanding. Provided information on BELT program, Support Group and [facility] outpatient pharmacy. All questions answered, will continue to monitor.

## 2021-10-29 NOTE — Progress Notes (Signed)
Progress Note: General Surgery Service   Chief Complaint/Subjective: Lots of pain around incisions.  Tylenol not helping much.  Tolerating liquids, nausea under control.  Objective: Vital signs in last 24 hours: Temp:  [97.4 F (36.3 C)-98.6 F (37 C)] 98.3 F (36.8 C) (07/11 0454) Pulse Rate:  [88-103] 89 (07/11 0454) Resp:  [12-18] 16 (07/11 0454) BP: (138-171)/(88-117) 145/95 (07/11 0454) SpO2:  [89 %-100 %] 93 % (07/11 0454) Weight:  [137.7 kg] 137.7 kg (07/10 0905) Last BM Date : 10/27/21  Intake/Output from previous day: 07/10 0701 - 07/11 0700 In: 1060 [P.O.:360; I.V.:600; IV Piggyback:100] Out: 1320 [Urine:1300; Blood:20] Intake/Output this shift: No intake/output data recorded.  Constitutional: NAD; conversant; no deformities Eyes: Moist conjunctiva; no lid lag; anicteric; PERRL Neck: Trachea midline; no thyromegaly Lungs: Normal respiratory effort; no tactile fremitus CV: RRR; no palpable thrills; no pitting edema GI: Abd Soft, incisions c/d/I w/ glue; no palpable hepatosplenomegaly MSK: Normal range of motion of extremities; no clubbing/cyanosis Psychiatric: Appropriate affect; alert and oriented x3 Lymphatic: No palpable cervical or axillary lymphadenopathy  Lab Results: CBC  Recent Labs    10/28/21 1904 10/29/21 0448  WBC 10.2 9.3  HGB 14.0 13.0  HCT 41.9 39.1  PLT 219 224   BMET Recent Labs    10/28/21 1904  CREATININE 0.91   PT/INR No results for input(s): "LABPROT", "INR" in the last 72 hours. ABG No results for input(s): "PHART", "HCO3" in the last 72 hours.  Invalid input(s): "PCO2", "PO2"  Anti-infectives: Anti-infectives (From admission, onward)    Start     Dose/Rate Route Frequency Ordered Stop   10/28/21 0900  cefoTEtan (CEFOTAN) 2 g in sodium chloride 0.9 % 100 mL IVPB        2 g 200 mL/hr over 30 Minutes Intravenous On call to O.R. 10/28/21 0856 10/28/21 1202       Medications: Scheduled Meds:  acetaminophen  1,000 mg  Oral Q8H   Or   acetaminophen (TYLENOL) oral liquid 160 mg/5 mL  1,000 mg Oral Q8H   DULoxetine  60 mg Oral QHS   enoxaparin (LOVENOX) injection  30 mg Subcutaneous Q12H   fluticasone  2 spray Each Nare Daily   gabapentin  600 mg Oral QHS   lurasidone  60 mg Oral Q supper   pantoprazole (PROTONIX) IV  40 mg Intravenous QHS   Ensure Max Protein  2 oz Oral Q2H   QUEtiapine  400 mg Oral QHS   Continuous Infusions: PRN Meds:.clonazePAM, hydrOXYzine, morphine injection, ondansetron (ZOFRAN) IV, oxyCODONE, simethicone  Assessment/Plan: s/p Procedure(s): ROBOTIC GASTRIC BYPASS RNY UPPER GI ENDOSCOPY 10/28/2021  Doing well POD1 Plan to stay on floor today until pain under better control, possible discharge tomorrow    LOS: 1 day      Felicie Morn, MD  [facility] Use [email or web address] to contact on call provider  Daily Billing: [phone] - post op

## 2021-10-30 LAB — CBC WITH DIFFERENTIAL/PLATELET
Abs Immature Granulocytes: 0.01 10*3/uL (ref 0.00–0.07)
Basophils Absolute: 0 10*3/uL (ref 0.0–0.1)
Basophils Relative: 0 %
Eosinophils Absolute: 0 10*3/uL (ref 0.0–0.5)
Eosinophils Relative: 1 %
HCT: 37.8 % (ref 36.0–46.0)
Hemoglobin: 12.4 g/dL (ref 12.0–15.0)
Immature Granulocytes: 0 %
Lymphocytes Relative: 29 %
Lymphs Abs: 2.3 10*3/uL (ref 0.7–4.0)
MCH: 31.6 pg (ref 26.0–34.0)
MCHC: 32.8 g/dL (ref 30.0–36.0)
MCV: 96.4 fL (ref 80.0–100.0)
Monocytes Absolute: 0.5 10*3/uL (ref 0.1–1.0)
Monocytes Relative: 7 %
Neutro Abs: 5.1 10*3/uL (ref 1.7–7.7)
Neutrophils Relative %: 63 %
Platelets: 218 10*3/uL (ref 150–400)
RBC: 3.92 MIL/uL (ref 3.87–5.11)
RDW: 13 % (ref 11.5–15.5)
WBC: 8 10*3/uL (ref 4.0–10.5)
nRBC: 0 % (ref 0.0–0.2)

## 2021-10-30 MED ORDER — DOCUSATE SODIUM 100 MG PO CAPS
100.0000 mg | ORAL_CAPSULE | Freq: Two times a day (BID) | ORAL | Status: DC
  Administered 2021-10-30 – 2021-10-31 (×3): 100 mg via ORAL
  Filled 2021-10-30 (×3): qty 1

## 2021-10-30 MED ORDER — BISACODYL 5 MG PO TBEC: 5.0000 mg | DELAYED_RELEASE_TABLET | Freq: Every day | ORAL | Status: DC | PRN

## 2021-10-30 MED ORDER — LACTATED RINGERS IV SOLN: INTRAVENOUS | Status: DC

## 2021-10-30 MED ORDER — HYDRALAZINE HCL 20 MG/ML IJ SOLN
10.0000 mg | INTRAMUSCULAR | Status: DC | PRN
Start: 2021-10-30 — End: 2021-10-31
  Administered 2021-10-30: 10 mg via INTRAVENOUS
  Filled 2021-10-30: qty 1

## 2021-10-30 MED ORDER — POLYETHYLENE GLYCOL 3350 17 G PO PACK
17.0000 g | PACK | Freq: Every day | ORAL | Status: DC
  Filled 2021-10-30: qty 1

## 2021-10-30 NOTE — Progress Notes (Signed)
  Transition of Care (TOC) Screening Note   Patient Details  Name: Erica Neal Date of Birth: June 17, 1980   Transition of Care [facility]) CM/SW Contact:    Lennart Pall, LCSW Phone Number: 10/30/2021, 10:06 AM    Transition of Care Department [facility]) has reviewed patient and no TOC needs have been identified at this time. We will continue to monitor patient advancement through interdisciplinary progression rounds. If new patient transition needs arise, please place a TOC consult.

## 2021-10-30 NOTE — Progress Notes (Signed)
Rounded with Dr. Thermon Leyland to see pt.  Discussed slow progression and likely causes.  Patient and MD agreed for patient to stay another night and to reevaluate in the morning.

## 2021-10-30 NOTE — Progress Notes (Signed)
Progress Note: General Surgery Service   Chief Complaint/Subjective: Upper abdominal soreness.  Nausea.  Fluid went down better yesterday than today.  Objective: Vital signs in last 24 hours: Temp:  [97.3 F (36.3 C)-98.5 F (36.9 C)] 98.5 F (36.9 C) (07/12 0810) Pulse Rate:  [78-105] 98 (07/12 0810) Resp:  [16-18] 16 (07/12 0810) BP: (154-168)/(90-118) 164/118 (07/12 0810) SpO2:  [91 %-100 %] 94 % (07/12 0810) Weight:  [137.7 kg] 137.7 kg (07/12 0751) Last BM Date : 10/27/21  Intake/Output from previous day: 07/11 0701 - 07/12 0700 In: 750 [P.O.:750] Out: 1200 [Urine:1200] Intake/Output this shift: No intake/output data recorded.  Constitutional: NAD; conversant; no deformities Eyes: Moist conjunctiva; no lid lag; anicteric; PERRL Neck: Trachea midline; no thyromegaly Lungs: Normal respiratory effort; no tactile fremitus CV: RRR; no palpable thrills; no pitting edema GI: Abd Soft, incisions c/d/I w/ glue; no palpable hepatosplenomegaly MSK: Normal range of motion of extremities; no clubbing/cyanosis Psychiatric: Appropriate affect; alert and oriented x3 Lymphatic: No palpable cervical or axillary lymphadenopathy  Lab Results: CBC  Recent Labs    10/29/21 0448 10/30/21 0505  WBC 9.3 8.0  HGB 13.0 12.4  HCT 39.1 37.8  PLT 224 218    BMET Recent Labs    10/28/21 1904  CREATININE 0.91    PT/INR No results for input(s): "LABPROT", "INR" in the last 72 hours. ABG No results for input(s): "PHART", "HCO3" in the last 72 hours.  Invalid input(s): "PCO2", "PO2"  Anti-infectives: Anti-infectives (From admission, onward)    Start     Dose/Rate Route Frequency Ordered Stop   10/28/21 0900  cefoTEtan (CEFOTAN) 2 g in sodium chloride 0.9 % 100 mL IVPB        2 g 200 mL/hr over 30 Minutes Intravenous On call to O.R. 10/28/21 0856 10/28/21 1202       Medications: Scheduled Meds:  acetaminophen  1,000 mg Oral Q8H   Or   acetaminophen (TYLENOL) oral liquid  160 mg/5 mL  1,000 mg Oral Q8H   DULoxetine  60 mg Oral QHS   enoxaparin (LOVENOX) injection  30 mg Subcutaneous Q12H   fluticasone  2 spray Each Nare Daily   gabapentin  600 mg Oral QHS   lurasidone  60 mg Oral Q supper   pantoprazole (PROTONIX) IV  40 mg Intravenous QHS   Ensure Max Protein  2 oz Oral Q2H   QUEtiapine  400 mg Oral QHS   Continuous Infusions: PRN Meds:.clonazePAM, hydrALAZINE, hydrOXYzine, morphine injection, ondansetron (ZOFRAN) IV, oxyCODONE, simethicone  Assessment/Plan: s/p Procedure(s): ROBOTIC GASTRIC BYPASS RNY UPPER GI ENDOSCOPY 10/28/2021  Doing okay POD2 Fluid intake still not at goal Still pain and nausea issues Continue care on floor till symptoms improve Labs and vitals re-assuring Hydralazine PRN for HTN Gentle IVF while PO intake is poor Start miralax    LOS: 2 days      Felicie Morn, MD  [facility] Use [email or web address] to contact on call provider  Daily Billing: [phone] - post op

## 2021-10-30 NOTE — Progress Notes (Signed)
Patient alert and oriented, Post op day 2.  Provided support and encouragement.  Encouraged pulmonary toilet, ambulation and small sips of liquids.  Will continue to monitor patient due to nausea and not meeting fluid goals.  All questions answered.  Will continue to monitor.

## 2021-10-31 DIAGNOSIS — F333 Major depressive disorder, recurrent, severe with psychotic symptoms: Secondary | ICD-10-CM | POA: Diagnosis not present

## 2021-10-31 LAB — BASIC METABOLIC PANEL
Anion gap: 7 (ref 5–15)
BUN: 16 mg/dL (ref 6–20)
CO2: 28 mmol/L (ref 22–32)
Calcium: 9 mg/dL (ref 8.9–10.3)
Chloride: 104 mmol/L (ref 98–111)
Creatinine, Ser: 0.75 mg/dL (ref 0.44–1.00)
GFR, Estimated: >60 mL/min (ref >60)
Glucose, Bld: 93 mg/dL (ref 70–99)
Potassium: 3.5 mmol/L (ref 3.5–5.1)
Sodium: 139 mmol/L (ref 135–145)

## 2021-10-31 LAB — CBC
HCT: 37.2 % (ref 36.0–46.0)
Hemoglobin: 12.3 g/dL (ref 12.0–15.0)
MCH: 32.4 pg (ref 26.0–34.0)
MCHC: 33.1 g/dL (ref 30.0–36.0)
MCV: 97.9 fL (ref 80.0–100.0)
Platelets: 258 10*3/uL (ref 150–400)
RBC: 3.8 MIL/uL — ABNORMAL LOW (ref 3.87–5.11)
RDW: 13.2 % (ref 11.5–15.5)
WBC: 7.1 10*3/uL (ref 4.0–10.5)
nRBC: 0 % (ref 0.0–0.2)

## 2021-10-31 NOTE — Progress Notes (Addendum)
Patient alert and oriented, Post op day 3.  Patient is alert and oriented sitting up in the bed.  Pt states that she is still having some discomfort, but it is manageable.  Reinforced discharge education in regard to hydration protocol, fluid and protein goals. Pt states that she had a BM this am.  Provided support and encouragement.  Encouraged pulmonary toilet, ambulation and small sips of liquids.  All questions answered.  Will continue to monitor.

## 2021-10-31 NOTE — Progress Notes (Signed)
Patient was given discharge instructions, and all questions were answered.  Patient was stable for discharge and was taken to the main exit by wheelchair. 

## 2021-11-01 ENCOUNTER — Telehealth (HOSPITAL_COMMUNITY): Payer: Self-pay | Admitting: *Deleted

## 2021-11-01 NOTE — Telephone Encounter (Signed)
1.  Tell me about your pain and pain management? Pt c/o right abdominal pain.  Pt states that ist is a "deep pain that is similar to when I had my hysterectomy". 8/10 at it's worst. Currently a 4/10 which is tolerable. Pt states that she has been taking oxycodone for the pain with relief. Discussed with patient to try and splint her abdomen with changing positions to assist with the discomfort.  Encouraged pt to try options and/or contact [facility] if still concerned.   2.  Let's talk about fluid intake.  How much total fluid are you taking in? Pt states that she is working to meet goal of 64 oz of fluid today. Discussed a plan to sip fluid every 3-5 minutes to prevent from "gulping" fluids which causes pain. Goal is to get at least 40oz of fluid today including two protein shakes to meet goal. Pt instructed to assess status and suggestions daily utilizing Hydration Action Plan on discharge folder and to call [facility] if in the "red zone".   3.  How much protein have you taken in the last 2 days? See #2.  4.  Have you had nausea?  Tell me about when have experienced nausea and what you did to help? Pt denies nausea.   5.  Has the frequency or color changed with your urine? Pt states that she is urinating "fine" with no changes in frequency or urgency.     6.  Tell me what your incisions look like? "Incisions look fine". Pt denies a fever, chills.  Pt states incisions are not swollen, open, or draining.  Pt encouraged to call [facility] if incisions change.   7.  Have you been passing gas? BM? Pt states that she is having BMs. Last BM 10/31/21.     8.  If a problem or question were to arise who would you call?  Do you know contact numbers for [place], [facility], and [facility]? Pt denies dehydration symptoms.  Pt can describe s/sx of dehydration.  Pt knows to call [facility] for surgical, [facility] for nutrition, and [facility] for non-urgent questions or concerns.   9.  How has the walking going? Pt states she is walking around and able to be  active without difficulty.   10. Are you still using your incentive spirometer?  If so, how often? Pt states that she is doing the I.S. once q2h. Pt encouraged to use incentive spirometer, at least 10x every hour while awake until she sees the surgeon.  11.  How are your vitamins and calcium going?  How are you taking them? Pt states that she has been taking her MVI, but not the calcium.  Instructed pt to get calcium citrate due to hx of kidney stones. Reinforced education about taking supplements at least two hours apart.  Reminded patient that the first 30 days post-operatively are important for successful recovery.  Practice good hand hygiene, wearing a mask when appropriate (since optional in most places), and minimizing exposure to people who live outside of the home, especially if they are exhibiting any respiratory, GI, or illness-like symptoms.

## 2021-11-01 NOTE — Discharge Summary (Signed)
Patient ID: Erica Neal [id number] 40 y.o. [birth year]  10/28/2021  Discharge date and time: 11/01/2021  Admitting Physician: [place]  Discharge Physician: [place]  Admission Diagnoses: Morbid obesity ([place]) [E66.01] Patient Active Problem List   Diagnosis Date Noted   Morbid obesity ([place]) 10/28/2021   Menorrhagia 04/09/2020   MDD (major depressive disorder) 09/15/2019     Discharge Diagnoses: Obesity Patient Active Problem List   Diagnosis Date Noted   Morbid obesity ([place]) 10/28/2021   Menorrhagia 04/09/2020   MDD (major depressive disorder) 09/15/2019    Operations: Procedure(s): ROBOTIC GASTRIC BYPASS RNY UPPER GI ENDOSCOPY  Admission Condition: good  Discharged Condition: good  Indication for Admission: Obesity  Hospital Course: Erica Neal was admitted for robotic gastric bypass, upper endoscopy and gastric wedge resection on 10/28/21.  She recovered in the hospital per bariatric protocol.  She had more epigastric pain which prolonged her stay, which I think was from the liver retractor as the patient is 5'1" and retraction of the liver and rib cage was difficult during the case.  Consults: None  Significant Diagnostic Studies: none  Treatments: surgery: as above  Disposition: Home  Patient Instructions:  Allergies as of 10/31/2021       Reactions   Erythromycin Anaphylaxis   Facial swelling   Azithromycin Swelling   Macrobid [nitrofurantoin] Hives        Medication List     TAKE these medications    acetaminophen 500 MG tablet Commonly known as: TYLENOL Take 2 tablets (1,000 mg total) by mouth every 8 (eight) hours for 5 days.   clonazePAM 0.5 MG tablet Commonly known as: KLONOPIN Take 0.5 mg by mouth daily as needed for anxiety.   DULoxetine 60 MG capsule Commonly known as: CYMBALTA Take 60 mg by mouth at bedtime.   fluticasone 50 MCG/ACT nasal spray Commonly known as: FLONASE Place 2 sprays into both nostrils  daily.   gabapentin 300 MG capsule Commonly known as: NEURONTIN Take 600 mg by mouth at bedtime. What changed: Another medication with the same name was added. Make sure you understand how and when to take each.   gabapentin 100 MG capsule Commonly known as: NEURONTIN Take 2 capsules (200 mg total) by mouth every 12 (twelve) hours. What changed: You were already taking a medication with the same name, and this prescription was added. Make sure you understand how and when to take each.   HYDROcodone-acetaminophen 5-325 MG tablet Commonly known as: NORCO/VICODIN Take 1-2 tablets by mouth every 6 (six) hours as needed for pain.   hydrOXYzine 50 MG tablet Commonly known as: ATARAX Take 50 mg by mouth every 6 (six) hours as needed for anxiety.   ibuprofen 600 MG tablet Commonly known as: ADVIL take 1 tablet po pc every 6 hours for 5 days then prn-post operative pain. Notes to patient: Avoid NSAIDs after surgery due to risk of GI (stomach) ulcer.   Lurasidone HCl 60 MG Tabs Take 60 mg by mouth at bedtime.   NON FORMULARY Pt uses a cpap nightly   ondansetron 4 MG disintegrating tablet Commonly known as: ZOFRAN-ODT Take 1 tablet (4 mg total) by mouth every 6 (six) hours as needed for nausea or vomiting.   oxyCODONE 5 MG immediate release tablet Commonly known as: Oxy IR/ROXICODONE Take 1 tablet (5 mg total) by mouth every 6 (six) hours as needed for severe pain.   pantoprazole 40 MG tablet Commonly known as: PROTONIX Take 40 mg by mouth daily as needed (  acid reflux). What changed: Another medication with the same name was added. Make sure you understand how and when to take each.   pantoprazole 40 MG tablet Commonly known as: PROTONIX Take 1 tablet (40 mg total) by mouth daily. What changed: You were already taking a medication with the same name, and this prescription was added. Make sure you understand how and when to take each.   QUEtiapine 400 MG 24 hr tablet Commonly  known as: SEROQUEL XR Take 400 mg by mouth at bedtime.        Activity: no heavy lifting for 4 weeks Diet:  bariatric diet protocol Wound Care: keep wound clean and dry  Follow-up:  With Dr. Thermon Leyland.  Signed: Nickola Major Frannie Shedrick [facility], [place]   11/01/2021, 7:22 AM

## 2021-11-04 DIAGNOSIS — G4733 Obstructive sleep apnea (adult) (pediatric): Secondary | ICD-10-CM | POA: Diagnosis not present

## 2021-11-06 DIAGNOSIS — F333 Major depressive disorder, recurrent, severe with psychotic symptoms: Secondary | ICD-10-CM | POA: Diagnosis not present

## 2021-11-12 ENCOUNTER — Encounter: Payer: Self-pay | Admitting: Dietician

## 2021-11-12 ENCOUNTER — Encounter: Payer: BC Managed Care – PPO | Attending: Surgery | Admitting: Dietician

## 2021-11-12 DIAGNOSIS — Z713 Dietary counseling and surveillance: Secondary | ICD-10-CM | POA: Insufficient documentation

## 2021-11-12 DIAGNOSIS — G4733 Obstructive sleep apnea (adult) (pediatric): Secondary | ICD-10-CM | POA: Insufficient documentation

## 2021-11-12 DIAGNOSIS — K219 Gastro-esophageal reflux disease without esophagitis: Secondary | ICD-10-CM | POA: Diagnosis not present

## 2021-11-12 DIAGNOSIS — E669 Obesity, unspecified: Secondary | ICD-10-CM

## 2021-11-12 DIAGNOSIS — Z6841 Body Mass Index (BMI) 40.0 and over, adult: Secondary | ICD-10-CM | POA: Insufficient documentation

## 2021-11-12 NOTE — Progress Notes (Signed)
2 Week Post-Operative Nutrition Class   Patient was seen on 11/12/2021 for Post-Operative Nutrition education at the [facility].    Surgery date: 10/28/2021 Surgery type: RYGB  Anthropometrics  Start weight at [facility]: 297.7 lbs (date: 05/02/2021)  Height: 61 in Weight today:  pt arrived late to class and did not get a wt. BMI: 56.25 kg/m2     Clinical  Medical hx: sleep apnea, depression, anxiety, bipolar Medications: see list  Labs: Vitamin D 23.4 Notable signs/symptoms: Acid reflux Any previous deficiencies? Yes: vitamin D     The following the learning objectives were met by the patient during this course: Identifies Phase 3 (Soft, High Proteins) Dietary Goals and will begin from 2 weeks post-operatively to 2 months post-operatively Identifies appropriate sources of fluids and proteins  Identifies appropriate fat sources and healthy verses unhealthy fat types   States protein recommendations and appropriate sources post-operatively Identifies the need for appropriate texture modifications, mastication, and bite sizes when consuming solids Identifies appropriate fat consumption and sources Identifies appropriate multivitamin and calcium sources post-operatively Describes the need for physical activity post-operatively and will follow MD recommendations States when to call healthcare provider regarding medication questions or post-operative complications   Handouts given during class include: Phase 3A: Soft, High Protein Diet Handout Phase 3 High Protein Meals Healthy Fats   Follow-Up Plan: Patient will follow-up at [facility] in 6 weeks for 2 month post-op nutrition visit for diet advancement per MD.

## 2021-11-18 ENCOUNTER — Telehealth: Payer: Self-pay | Admitting: Dietician

## 2021-11-18 NOTE — Telephone Encounter (Signed)
RD called pt to verify fluid intake once starting soft, solid proteins 2 week post-bariatric surgery.   Daily Fluid intake: 40 oz Daily Protein intake: 1 or 2 ounces of meat at meal time; supplementing with protein shakes Bowel Habits: meat is harder to go to the bathroom; Murelax is helping.  Concerns/issues:  Dietitian encouraged pt to get more fluids.  This will help with constipation.

## 2021-11-26 DIAGNOSIS — G4733 Obstructive sleep apnea (adult) (pediatric): Secondary | ICD-10-CM | POA: Diagnosis not present

## 2021-11-27 DIAGNOSIS — F333 Major depressive disorder, recurrent, severe with psychotic symptoms: Secondary | ICD-10-CM | POA: Diagnosis not present

## 2021-12-20 DIAGNOSIS — J310 Chronic rhinitis: Secondary | ICD-10-CM | POA: Diagnosis not present

## 2021-12-20 DIAGNOSIS — H1045 Other chronic allergic conjunctivitis: Secondary | ICD-10-CM | POA: Diagnosis not present

## 2021-12-20 DIAGNOSIS — J4520 Mild intermittent asthma, uncomplicated: Secondary | ICD-10-CM | POA: Diagnosis not present

## 2021-12-20 DIAGNOSIS — T781XXD Other adverse food reactions, not elsewhere classified, subsequent encounter: Secondary | ICD-10-CM | POA: Diagnosis not present

## 2021-12-25 DIAGNOSIS — F333 Major depressive disorder, recurrent, severe with psychotic symptoms: Secondary | ICD-10-CM | POA: Diagnosis not present

## 2021-12-27 ENCOUNTER — Ambulatory Visit: Payer: BC Managed Care – PPO | Admitting: Dietician

## 2022-01-17 ENCOUNTER — Other Ambulatory Visit: Payer: BC Managed Care – PPO

## 2022-02-14 DIAGNOSIS — F333 Major depressive disorder, recurrent, severe with psychotic symptoms: Secondary | ICD-10-CM | POA: Diagnosis not present

## 2022-03-19 DIAGNOSIS — F333 Major depressive disorder, recurrent, severe with psychotic symptoms: Secondary | ICD-10-CM | POA: Diagnosis not present

## 2022-04-16 DIAGNOSIS — F333 Major depressive disorder, recurrent, severe with psychotic symptoms: Secondary | ICD-10-CM | POA: Diagnosis not present

## 2022-05-07 DIAGNOSIS — F333 Major depressive disorder, recurrent, severe with psychotic symptoms: Secondary | ICD-10-CM | POA: Diagnosis not present

## 2022-05-16 DIAGNOSIS — F333 Major depressive disorder, recurrent, severe with psychotic symptoms: Secondary | ICD-10-CM | POA: Diagnosis not present

## 2022-06-02 DIAGNOSIS — F333 Major depressive disorder, recurrent, severe with psychotic symptoms: Secondary | ICD-10-CM | POA: Diagnosis not present

## 2022-08-28 ENCOUNTER — Encounter (HOSPITAL_COMMUNITY): Payer: Self-pay | Admitting: *Deleted

## 2022-12-15 ENCOUNTER — Other Ambulatory Visit: Payer: Self-pay | Admitting: Urology

## 2022-12-29 ENCOUNTER — Encounter (HOSPITAL_BASED_OUTPATIENT_CLINIC_OR_DEPARTMENT_OTHER): Payer: Self-pay | Admitting: Urology

## 2022-12-29 ENCOUNTER — Other Ambulatory Visit: Payer: Self-pay

## 2022-12-29 NOTE — Progress Notes (Signed)
Spoke w/ via phone for pre-op interview---pt Lab needs dos---- none              Lab results------none COVID test -----patient states asymptomatic no test needed Arrive at -------1130 01-06-2023 NPO after MN NO Solid Food.  Clear liquids from MN until---1030 Med rec completed Medications to take morning of surgery -----klonazepam, hydroxyazine prn Diabetic medication -----n/a Patient instructed no nail polish to be worn day of surgery Patient instructed to bring photo id and insurance card day of surgery Patient aware to have Driver (ride ) / caregiver     Erica Neal spouse for 24 hours after surgery  Patient Special Instructions -----none Pre-Op special Instructions -----none Patient verbalized understanding of instructions that were given at this phone interview. Patient denies shortness of breath, chest pain, fever, cough at this phone interview.

## 2023-01-05 NOTE — H&P (Signed)
CC/HPI: cc: Gross hematuria   10/22/2022: 42 year old woman comes in with several weeks of painless gross hematuria. She has had a hysterectomy for and this is not vaginal bleeding. She is been told she has a nonobstructing renal calculus. She is not experiencing any flank pain, nausea or vomiting. She is very anxious about what this could be. She does not have any dysuria or urinary symptoms.   12/03/2022: 42 year old woman who developed painless gross hematuria beginning in June 2024 here for follow-up. CT abdomen pelvis was done and did not show a source of hematuria. Diagnostic cystoscopy also showed normal bladder mucosa but there was bloody urine which somewhat impaired visualization. Patient is very anxious and is wondering what else it could be. Urinalysis today shows trace protein with 10-20 RBCs. It is also contaminated with epithelial cells. She brings in blood work today and it shows a stable hemoglobin at 13.4.     ALLERGIES: Erythromycin - Swelling on face Macrobid - Hives    MEDICATIONS: No Medications    GU PSH: Cystoscopy - 10/22/2022 Hysterectomy, 2021     NON-GU PSH: Gastric bypass, 2023 Remove Gallbladder, 2022     GU PMH: Gross hematuria - 11/05/2022, - 10/22/2022      PMH Notes: H/O stomach ulcers   NON-GU PMH: Anxiety Arthritis Depression GERD Sleep Apnea    FAMILY HISTORY: 2 daughters - Daughter Diabetes - Grandmother pancreatic cancer - Father    Notes: Cancer markers- Mother/Father   SOCIAL HISTORY: Marital Status: Married Preferred Language: English; Ethnicity: Not Hispanic Or Latino; Race: Black or African American Current Smoking Status: Patient has never smoked.   Tobacco Use Assessment Completed: Used Tobacco in last 30 days? Social Drinker.  Does not drink caffeine. Patient's occupation Economist.    REVIEW OF SYSTEMS:    GU Review Female:   Patient denies frequent urination, hard to postpone urination, burning /pain with  urination, get up at night to urinate, leakage of urine, stream starts and stops, trouble starting your stream, have to strain to urinate, and being pregnant.  Gastrointestinal (Upper):   Patient denies nausea, vomiting, and indigestion/ heartburn.  Gastrointestinal (Lower):   Patient denies diarrhea and constipation.  Constitutional:   Patient denies fever, night sweats, weight loss, and fatigue.  Skin:   Patient denies skin rash/ lesion and itching.  Eyes:   Patient denies blurred vision and double vision.  Ears/ Nose/ Throat:   Patient denies sore throat and sinus problems.  Hematologic/Lymphatic:   Patient denies swollen glands and easy bruising.  Cardiovascular:   Patient denies leg swelling and chest pains.  Respiratory:   Patient denies cough and shortness of breath.  Endocrine:   Patient denies excessive thirst.  Musculoskeletal:   Patient denies back pain and joint pain.  Neurological:   Patient denies headaches and dizziness.  Psychologic:   Patient denies anxiety and depression.   Notes: Pt brought in labs from blood work, hematuria    VITAL SIGNS:      12/03/2022 02:25 PM  Weight 202 lb / 91.63 kg  Height 61 in / 154.94 cm  BP 95/72 mmHg  Pulse 76 /min  Temperature 97.8 F / 36.5 C  BMI 38.2 kg/m   MULTI-SYSTEM PHYSICAL EXAMINATION:    Constitutional: Well-nourished. No physical deformities. Normally developed. Good grooming.  Neck: Neck symmetrical, not swollen. Normal tracheal position.  Respiratory: No labored breathing, no use of accessory muscles.   Skin: No paleness, no jaundice, no cyanosis. No lesion, no ulcer,  no rash.  Neurologic / Psychiatric: Oriented to time, oriented to place, oriented to person. No depression, no anxiety, no agitation.  Eyes: Normal conjunctivae. Normal eyelids.  Ears, Nose, Mouth, and Throat: Left ear no scars, no lesions, no masses. Right ear no scars, no lesions, no masses. Nose no scars, no lesions, no masses. Normal hearing. Normal  lips.  Musculoskeletal: Normal gait and station of head and neck.     Complexity of Data:  Records Review:   Previous Patient Records, POC Tool  Urine Test Review:   Urinalysis  X-Ray Review: C.T. Abdomen/Pelvis: Reviewed Films. Reviewed Report. Discussed With Patient. IMPRESSION:  Left nephrolithiasis, including 5 mm calculus in left renal pelvis.  No evidence of ureteral calculi, hydronephrosis, or other acute  findings.   No radiographic evidence of urinary tract neoplasm.    Electronically Signed  By: Danae Orleans M.D.  On: 11/11/2022 15:44    PROCEDURES:          Urinalysis w/Scope Dipstick Dipstick Cont'd Micro  Color: Yellow Bilirubin: Neg mg/dL WBC/hpf: 6 - 86/VHQ  Appearance: Cloudy Ketones: Neg mg/dL RBC/hpf: 10 - 46/NGE  Specific Gravity: 1.020 Blood: 3+ ery/uL Bacteria: Many (>50/hpf)  pH: 6.0 Protein: Trace mg/dL Cystals: NS (Not Seen)  Glucose: Neg mg/dL Urobilinogen: 0.2 mg/dL Casts: Hyaline    Nitrites: Neg Trichomonas: Not Present    Leukocyte Esterase: 1+ leu/uL Mucous: Present      Epithelial Cells: 20 - 40/hpf      Yeast: NS (Not Seen)      Sperm: Not Present    Notes: too numerous to count    ASSESSMENT:      ICD-10 Details  1 GU:   Gross hematuria - R31.0 Undiagnosed New Problem   PLAN:           Document Letter(s):  Created for Patient: Clinical Summary         Notes:   Gross hematuria:  -Reviewed CT scan with patient and discussed cystoscopy findings again. Discussed that I could take her to the operating room for a more detailed cystoscopy and see if visualization would be better with improved flow. I could also do a retrograde pyelogram and look for localization of any hematuria.  -Will also refer to nephrology as she has had persistent brown/red urine for almost 3 months now without a source.

## 2023-01-05 NOTE — Anesthesia Preprocedure Evaluation (Signed)
Anesthesia Evaluation  Patient identified by MRN, date of birth, ID band Patient awake    Reviewed: Allergy & Precautions, NPO status , Patient's Chart, lab work & pertinent test results  Airway Mallampati: II  TM Distance: >3 FB Neck ROM: Full    Dental no notable dental hx. (+) Teeth Intact, Dental Advisory Given   Pulmonary sleep apnea    Pulmonary exam normal breath sounds clear to auscultation       Cardiovascular negative cardio ROS Normal cardiovascular exam+ dysrhythmias  Rhythm:Regular Rate:Normal     Neuro/Psych  PSYCHIATRIC DISORDERS Anxiety Depression Bipolar Disorder      GI/Hepatic negative GI ROS, Neg liver ROS,,,  Endo/Other  negative endocrine ROS    Renal/GU      Musculoskeletal  (+) Arthritis ,    Abdominal  (+) + obese (BMI 37.8)  Peds  Hematology   Anesthesia Other Findings ALL: Erythromycin, azithromycin, macrobid  Reproductive/Obstetrics                             Anesthesia Physical Anesthesia Plan  ASA: 3  Anesthesia Plan: General   Post-op Pain Management: Tylenol PO (pre-op)* and Precedex   Induction: Intravenous  PONV Risk Score and Plan: 4 or greater and Treatment may vary due to age or medical condition, Midazolam, Ondansetron and Dexamethasone  Airway Management Planned: LMA  Additional Equipment: None  Intra-op Plan:   Post-operative Plan:   Informed Consent: I have reviewed the patients History and Physical, chart, labs and discussed the procedure including the risks, benefits and alternatives for the proposed anesthesia with the patient or authorized representative who has indicated his/her understanding and acceptance.     Dental advisory given  Plan Discussed with: CRNA  Anesthesia Plan Comments:         Anesthesia Quick Evaluation

## 2023-01-06 ENCOUNTER — Ambulatory Visit (HOSPITAL_BASED_OUTPATIENT_CLINIC_OR_DEPARTMENT_OTHER)
Admission: RE | Admit: 2023-01-06 | Discharge: 2023-01-06 | Disposition: A | Discharge status: Home / Self Care | Payer: 59 | Attending: Urology | Admitting: Urology

## 2023-01-06 ENCOUNTER — Encounter (HOSPITAL_BASED_OUTPATIENT_CLINIC_OR_DEPARTMENT_OTHER)
Admission: RE | Disposition: A | Discharge status: Home / Self Care | Payer: Self-pay | Source: Home / Self Care | Attending: Urology

## 2023-01-06 ENCOUNTER — Ambulatory Visit (HOSPITAL_BASED_OUTPATIENT_CLINIC_OR_DEPARTMENT_OTHER): Payer: 59 | Admitting: Anesthesiology

## 2023-01-06 ENCOUNTER — Other Ambulatory Visit: Payer: Self-pay

## 2023-01-06 ENCOUNTER — Encounter (HOSPITAL_BASED_OUTPATIENT_CLINIC_OR_DEPARTMENT_OTHER): Payer: Self-pay | Admitting: Urology

## 2023-01-06 DIAGNOSIS — R310 Gross hematuria: Secondary | ICD-10-CM | POA: Diagnosis present

## 2023-01-06 DIAGNOSIS — Z6838 Body mass index (BMI) 38.0-38.9, adult: Secondary | ICD-10-CM | POA: Diagnosis not present

## 2023-01-06 DIAGNOSIS — F418 Other specified anxiety disorders: Secondary | ICD-10-CM

## 2023-01-06 DIAGNOSIS — F419 Anxiety disorder, unspecified: Secondary | ICD-10-CM | POA: Insufficient documentation

## 2023-01-06 DIAGNOSIS — F319 Bipolar disorder, unspecified: Secondary | ICD-10-CM | POA: Diagnosis not present

## 2023-01-06 DIAGNOSIS — N3289 Other specified disorders of bladder: Secondary | ICD-10-CM | POA: Diagnosis not present

## 2023-01-06 DIAGNOSIS — Z01818 Encounter for other preprocedural examination: Secondary | ICD-10-CM

## 2023-01-06 DIAGNOSIS — E669 Obesity, unspecified: Secondary | ICD-10-CM | POA: Diagnosis not present

## 2023-01-06 DIAGNOSIS — M1990 Unspecified osteoarthritis, unspecified site: Secondary | ICD-10-CM | POA: Insufficient documentation

## 2023-01-06 DIAGNOSIS — N200 Calculus of kidney: Secondary | ICD-10-CM | POA: Insufficient documentation

## 2023-01-06 DIAGNOSIS — G4730 Sleep apnea, unspecified: Secondary | ICD-10-CM | POA: Insufficient documentation

## 2023-01-06 HISTORY — PX: CYSTOSCOPY/URETEROSCOPY/HOLMIUM LASER/STENT PLACEMENT: SHX6546

## 2023-01-06 HISTORY — DX: Sleep apnea, unspecified: G47.30

## 2023-01-06 HISTORY — DX: Presence of spectacles and contact lenses: Z97.3

## 2023-01-06 HISTORY — DX: Personal history of urinary calculi: Z87.442

## 2023-01-06 SURGERY — CYSTOSCOPY/URETEROSCOPY/HOLMIUM LASER/STENT PLACEMENT
Anesthesia: General | Laterality: Bilateral

## 2023-01-06 MED ORDER — OXYCODONE HCL 5 MG/5ML PO SOLN: 5.0000 mg | Freq: Once | ORAL | Status: DC | PRN

## 2023-01-06 MED ORDER — HYDROMORPHONE HCL 1 MG/ML IJ SOLN: 0.2500 mg | INTRAMUSCULAR | Status: DC | PRN

## 2023-01-06 MED ORDER — OXYCODONE HCL 5 MG PO TABS
5.0000 mg | ORAL_TABLET | Freq: Three times a day (TID) | ORAL | 0 refills | Status: AC | PRN
Start: 2023-01-06 — End: 2024-01-06

## 2023-01-06 MED ORDER — MIDAZOLAM HCL 2 MG/2ML IJ SOLN
INTRAMUSCULAR | Status: AC
  Filled 2023-01-06: qty 2

## 2023-01-06 MED ORDER — DEXAMETHASONE SODIUM PHOSPHATE 4 MG/ML IJ SOLN
INTRAMUSCULAR | Status: DC | PRN
  Administered 2023-01-06: 10 mg via INTRAVENOUS

## 2023-01-06 MED ORDER — STERILE WATER FOR IRRIGATION IR SOLN
Status: DC | PRN
Start: 2023-01-06 — End: 2023-01-06
  Administered 2023-01-06: 3000 mL

## 2023-01-06 MED ORDER — ACETAMINOPHEN 500 MG PO TABS
1000.0000 mg | ORAL_TABLET | Freq: Once | ORAL | Status: AC
  Administered 2023-01-06: 1000 mg via ORAL

## 2023-01-06 MED ORDER — EPHEDRINE SULFATE (PRESSORS) 50 MG/ML IJ SOLN
INTRAMUSCULAR | Status: DC | PRN
Start: 2023-01-06 — End: 2023-01-06
  Administered 2023-01-06 (×3): 5 mg via INTRAVENOUS
  Administered 2023-01-06: 10 mg via INTRAVENOUS

## 2023-01-06 MED ORDER — ONDANSETRON HCL 4 MG/2ML IJ SOLN
INTRAMUSCULAR | Status: AC
  Filled 2023-01-06: qty 2

## 2023-01-06 MED ORDER — CIPROFLOXACIN IN D5W 400 MG/200ML IV SOLN
400.0000 mg | INTRAVENOUS | Status: AC
  Administered 2023-01-06: 400 mg via INTRAVENOUS

## 2023-01-06 MED ORDER — FENTANYL CITRATE (PF) 100 MCG/2ML IJ SOLN
INTRAMUSCULAR | Status: AC
  Filled 2023-01-06: qty 2

## 2023-01-06 MED ORDER — SODIUM CHLORIDE 0.9 % IR SOLN
Status: DC | PRN
  Administered 2023-01-06: 3000 mL via INTRAVESICAL

## 2023-01-06 MED ORDER — CIPROFLOXACIN HCL 250 MG PO TABS: 250.0000 mg | ORAL_TABLET | Freq: Every day | ORAL | 0 refills | Status: AC

## 2023-01-06 MED ORDER — KETOROLAC TROMETHAMINE 30 MG/ML IJ SOLN
INTRAMUSCULAR | Status: DC | PRN
Start: 2023-01-06 — End: 2023-01-06
  Administered 2023-01-06: 30 mg via INTRAVENOUS

## 2023-01-06 MED ORDER — LIDOCAINE HCL (PF) 2 % IJ SOLN
INTRAMUSCULAR | Status: AC
  Filled 2023-01-06: qty 5

## 2023-01-06 MED ORDER — MIDAZOLAM HCL 5 MG/5ML IJ SOLN
INTRAMUSCULAR | Status: DC | PRN
  Administered 2023-01-06: 2 mg via INTRAVENOUS

## 2023-01-06 MED ORDER — PROPOFOL 10 MG/ML IV BOLUS
INTRAVENOUS | Status: AC
  Filled 2023-01-06: qty 20

## 2023-01-06 MED ORDER — ACETAMINOPHEN 10 MG/ML IV SOLN: 1000.0000 mg | Freq: Once | INTRAVENOUS | Status: DC | PRN

## 2023-01-06 MED ORDER — PROPOFOL 10 MG/ML IV BOLUS
INTRAVENOUS | Status: DC | PRN
  Administered 2023-01-06: 200 mg via INTRAVENOUS

## 2023-01-06 MED ORDER — OXYCODONE HCL 5 MG PO TABS: 5.0000 mg | ORAL_TABLET | Freq: Once | ORAL | Status: DC | PRN

## 2023-01-06 MED ORDER — DEXMEDETOMIDINE HCL IN NACL 80 MCG/20ML IV SOLN
INTRAVENOUS | Status: DC | PRN
  Administered 2023-01-06: 4 ug via INTRAVENOUS

## 2023-01-06 MED ORDER — ONDANSETRON HCL 4 MG/2ML IJ SOLN: 4.0000 mg | Freq: Once | INTRAMUSCULAR | Status: DC | PRN

## 2023-01-06 MED ORDER — ONDANSETRON HCL 4 MG/2ML IJ SOLN
INTRAMUSCULAR | Status: DC | PRN
  Administered 2023-01-06: 4 mg via INTRAVENOUS

## 2023-01-06 MED ORDER — FENTANYL CITRATE (PF) 100 MCG/2ML IJ SOLN
INTRAMUSCULAR | Status: DC | PRN
  Administered 2023-01-06: 50 ug via INTRAVENOUS

## 2023-01-06 MED ORDER — ACETAMINOPHEN 500 MG PO TABS
ORAL_TABLET | ORAL | Status: AC
  Filled 2023-01-06: qty 2

## 2023-01-06 MED ORDER — PHENYLEPHRINE HCL (PRESSORS) 10 MG/ML IV SOLN
INTRAVENOUS | Status: DC | PRN
Start: 2023-01-06 — End: 2023-01-06
  Administered 2023-01-06: 80 ug via INTRAVENOUS

## 2023-01-06 MED ORDER — CIPROFLOXACIN IN D5W 400 MG/200ML IV SOLN
INTRAVENOUS | Status: AC
  Filled 2023-01-06: qty 200

## 2023-01-06 MED ORDER — DROPERIDOL 2.5 MG/ML IJ SOLN: 0.6250 mg | Freq: Once | INTRAMUSCULAR | Status: DC | PRN

## 2023-01-06 MED ORDER — IOHEXOL 300 MG/ML  SOLN
INTRAMUSCULAR | Status: DC | PRN
  Administered 2023-01-06: 20 mL via URETHRAL

## 2023-01-06 MED ORDER — DEXAMETHASONE SODIUM PHOSPHATE 10 MG/ML IJ SOLN
INTRAMUSCULAR | Status: AC
  Filled 2023-01-06: qty 1

## 2023-01-06 MED ORDER — LIDOCAINE HCL (CARDIAC) PF 100 MG/5ML IV SOSY
PREFILLED_SYRINGE | INTRAVENOUS | Status: DC | PRN
  Administered 2023-01-06: 100 mg via INTRAVENOUS

## 2023-01-06 MED ORDER — LIDOCAINE 2% (20 MG/ML) 5 ML SYRINGE: INTRAMUSCULAR | Status: DC | PRN

## 2023-01-06 MED ORDER — TAMSULOSIN HCL 0.4 MG PO CAPS: 0.4000 mg | ORAL_CAPSULE | Freq: Every day | ORAL | 0 refills | Status: AC

## 2023-01-06 MED ORDER — LACTATED RINGERS IV SOLN: INTRAVENOUS | Status: DC

## 2023-01-06 SURGICAL SUPPLY — 23 items
BAG DRAIN URO-CYSTO SKYTR STRL (DRAIN) ×1 IMPLANT
BAG DRN UROCATH (DRAIN) ×1
BASKET ZERO TIP NITINOL 2.4FR (BASKET) IMPLANT
BSKT STON RTRVL ZERO TP 2.4FR (BASKET) ×1
CATH URETL OPEN 5X70 (CATHETERS) ×1 IMPLANT
CLOTH BEACON ORANGE TIMEOUT ST (SAFETY) ×1 IMPLANT
DRSG TEGADERM 4X4.75 (GAUZE/BANDAGES/DRESSINGS) IMPLANT
EXTRACTOR STONE 1.7FRX115CM (UROLOGICAL SUPPLIES) IMPLANT
GLOVE BIO SURGEON STRL SZ 6.5 (GLOVE) ×1 IMPLANT
GOWN STRL REUS W/TWL LRG LVL3 (GOWN DISPOSABLE) ×1 IMPLANT
GUIDEWIRE STR DUAL SENSOR (WIRE) ×1 IMPLANT
IV NS IRRIG 3000ML ARTHROMATIC (IV SOLUTION) ×1 IMPLANT
KIT TURNOVER CYSTO (KITS) ×1 IMPLANT
MANIFOLD NEPTUNE II (INSTRUMENTS) ×1 IMPLANT
PACK CYSTO (CUSTOM PROCEDURE TRAY) ×1 IMPLANT
SHEATH NAVIGATOR HD 12/14X36 (SHEATH) IMPLANT
SLEEVE SCD COMPRESS KNEE MED (STOCKING) ×1 IMPLANT
STENT URET 6FRX24 CONTOUR (STENTS) IMPLANT
TRACTIP FLEXIVA PULS ID 200XHI (Laser) IMPLANT
TRACTIP FLEXIVA PULSE ID 200 (Laser) ×1
TUBE CONNECTING 12X1/4 (SUCTIONS) ×1 IMPLANT
TUBING UROLOGY SET (TUBING) ×1 IMPLANT
WATER STERILE IRR 3000ML UROMA (IV SOLUTION) IMPLANT

## 2023-01-06 NOTE — Interval H&P Note (Signed)
History and Physical Interval Note:  01/06/2023 11:44 AM  Erica Neal  has presented today for surgery, with the diagnosis of GROSS HEMATURIA.  The various methods of treatment have been discussed with the patient and family. After consideration of risks, benefits and other options for treatment, the patient has consented to  Procedure(s) with comments: DIAGNOSTIC CYSTOSCOPY, BILATERAL RETROGRADE PYELOGRAM, POSSIBLE URETEROSCOPY, POSSIBLE BIALTERAL URETERAL STENT PLACEMENT, POSSIBLE BIOPSY (Bilateral) - 75 MINUTES as a surgical intervention.  The patient's history has been reviewed, patient examined, no change in status, stable for surgery.  I have reviewed the patient's chart and labs.  Questions were answered to the patient's satisfaction.     Ronin Rehfeldt D Indalecio Malmstrom

## 2023-01-06 NOTE — Discharge Instructions (Addendum)
Cystoscopy patient instructions  Following a cystoscopy, a catheter (a flexible rubber tube) is sometimes left in place to empty the bladder. This may cause some discomfort or a feeling that you need to urinate. Your doctor determines the period of time that the catheter will be left in place. You may have bloody urine for two to three days (Call your doctor if the amount of bleeding increases or does not subside).  You may pass blood clots in your urine, especially if you had a biopsy. It is not unusual to pass small blood clots and have some bloody urine a couple of weeks after your cystoscopy. Again, call your doctor if the bleeding does not subside. You may have: Dysuria (painful urination) Frequency (urinating often) Urgency (strong desire to urinate)  These symptoms are common especially if medicine is instilled into the bladder or a ureteral stent is placed. Avoiding alcohol and caffeine, such as coffee, tea, and chocolate, may help relieve these symptoms. Drink plenty of water, unless otherwise instructed. Your doctor may also prescribe an antibiotic or other medicine to reduce these symptoms.  Cystoscopy results are available soon after the procedure; biopsy results usually take two to four days. Your doctor will discuss the results of your exam with you. Before you go home, you will be given specific instructions for follow-up care. Special Instructions:   If you are going home with a catheter in place do not take a tub bath until removed by your doctor.   You may resume your normal activities.   Do not drive or operate machinery if you are taking narcotic pain medicine.   Be sure to keep all follow-up appointments with your doctor.   Call Your Doctor If: The catheter is not draining You have severe pain You are unable to urinate You have a fever over 101 You have severe bleeding    Post stent placement instructions   Definitions:  Ureter: The duct that transports urine  from the kidney to the bladder. Stent: A plastic hollow tube that is placed into the ureter, from the kidney to the bladder to prevent the ureter from swelling shut.  General instructions:  Despite the fact that no skin incisions were used, the area around the ureter and bladder is raw and irritated. The stent is a foreign body which can further irritate the bladder wall. This irritation is manifested by increased frequency of urination, both day and night, and by an increase in the urge to urinate. In some, the urge to urinate is present almost always. Sometimes the urge is strong enough that you may not be able to stop your self from urinating. This can often be controlled with medication but does not occur in everyone. A stent can safely be left in place for 3 months or greater.  You may see some blood in your urine while the stent is in place and a few days afterward. Do not be alarmed, even if the urine is clear for a while. Get off your feet and drink lots of fluids until clearing occurs. If you start to pass clots or don't improve, call us.  Diet:  You may return to your normal diet immediately. Because of the raw surface of your bladder, alcohol, spicy foods, foods high in acid and drinks with caffeine may cause irritation or frequency and should be used in moderation. To keep your urine flowing freely and avoid constipation, drink plenty of fluids during the day (8-10 glasses). Tip: Avoid cranberry juice because  it is very acidic.  Activity:  Your physical activity doesn't need to be restricted. However, if you are very active, you may see some blood in the urine. We suggest that you reduce your activity under the circumstances until the bleeding has stopped.  Bowels:  It is important to keep your bowels regular during the postoperative period. Straining with bowel movements can cause bleeding. A bowel movement every other day is reasonable. Use a mild laxative if needed, such as milk of  magnesia 2-3 tablespoons, or 2 Dulcolax tablets. Call if you continue to have problems. If you had been taking narcotics for pain, before, during or after your surgery, you may be constipated. Take a laxative if necessary.  Medication:  Cipro - daily for UTI prevention Flomax - to help with stent discomfort Oxycodone - for severe postop pain Tylenol and ibuprofen can be used for mild to moderate pain  Stent removal: The stent can be removed on Friday, 9/20. To do this, pull the string that is in your vagina until the entire stent has come out.    You should resume your pre-surgery medications unless told not to. In addition you may be given an antibiotic to prevent or treat infection. Antibiotics are not always necessary. All medication should be taken as prescribed until the bottles are finished unless you are having an unusual reaction to one of the drugs.  Problems you should report to [place]:  a. Fever greater than 101F. b. Heavy bleeding, or clots (see notes above about blood in urine). c. Inability to urinate. d. Drug reactions (hives, rash, nausea, vomiting, diarrhea). e. Severe burning or pain with urination that is not improving.   No acetaminophen/Tylenol until after 5:21pm today if needed for pain.   No ibuprofen, Advil, Aleve, Motrin, ketorolac, meloxicam, naproxen, or other NSAIDS until after 7:30pm today if needed for pain.     Post Anesthesia Home Care Instructions  Activity: Get plenty of rest for the remainder of the day. A responsible individual must stay with you for 24 hours following the procedure.  For the next 24 hours, DO NOT: -Drive a car -Advertising copywriter -Drink alcoholic beverages -Take any medication unless instructed by your physician -Make any legal decisions or sign important papers.  Meals: Start with liquid foods such as gelatin or soup. Progress to regular foods as tolerated. Avoid greasy, spicy, heavy foods. If nausea and/or vomiting occur,  drink only clear liquids until the nausea and/or vomiting subsides. Call your physician if vomiting continues.  Special Instructions/Symptoms: Your throat may feel dry or sore from the anesthesia or the breathing tube placed in your throat during surgery. If this causes discomfort, gargle with warm salt water. The discomfort should disappear within 24 hours.  If you had a scopolamine patch placed behind your ear for the management of post- operative nausea and/or vomiting:  1. The medication in the patch is effective for 72 hours, after which it should be removed.  Wrap patch in a tissue and discard in the trash. Wash hands thoroughly with soap and water. 2. You may remove the patch earlier than 72 hours if you experience unpleasant side effects which may include dry mouth, dizziness or visual disturbances. 3. Avoid touching the patch. Wash your hands with soap and water after contact with the patch.

## 2023-01-06 NOTE — Anesthesia Postprocedure Evaluation (Signed)
Anesthesia Post Note  Patient: Erica Neal  Procedure(s) Performed: DIAGNOSTIC CYSTOSCOPY, RIGHT RETROGRADE PYELOGRAM, `LEFT URETEROSCOPY, Left  LASER LITHOTRIPSY Left URETERAL STENT PLACEMENT, BLADDER BIOPSY AND FULGERRATION (Bilateral)     Patient location during evaluation: PACU Anesthesia Type: General Level of consciousness: awake and alert Pain management: pain level controlled Vital Signs Assessment: post-procedure vital signs reviewed and stable Respiratory status: spontaneous breathing, nonlabored ventilation, respiratory function stable and patient connected to nasal cannula oxygen Cardiovascular status: blood pressure returned to baseline and stable Postop Assessment: no apparent nausea or vomiting Anesthetic complications: no   No notable events documented.  Last Vitals:  Vitals:   01/06/23 1445 01/06/23 1537  BP: 126/75 115/73  Pulse: 64 61  Resp: 20 14  Temp: 36.4 C   SpO2: 94% 100%    Last Pain:  Vitals:   01/06/23 1537  TempSrc:   PainSc: 3                  Trevor Iha

## 2023-01-06 NOTE — Op Note (Signed)
Operative Note  Preoperative diagnosis:  1.  Gross hematuria  Postoperative diagnosis: 1.  Gross hematuria 2.  Abnormal bladder mucosa 3.  Left renal calculus  Procedure(s): 1.  Diagnostic cystoscopy 2.  Bladder biopsy with fulguration 3.  Bilateral retrograde pyelogram 4.  Diagnostic left ureteroscopy with laser lithotripsy and stent placement (6Fr x 24 cm JJ with tether)  Surgeon: Kasandra Knudsen, MD  Assistants:  None  Anesthesia:  General  Complications:  None  EBL:  minimal  Specimens: 1. Left posterior wall bladder biopsy 2.  Stone fragments (given to pt  Drains/Catheters: 1.  6Fr x 24 cm Left JJ ureteral stent with tether  Intraoperative findings:   Normal urethra Very small area of friable mucosa that was bleeding minimally on left posterior wall Left retrograde pyelogram showed a filling defect in the renal pelvis consistent with renal calculus Normal right retrograde pyelogram 6 mm left renal calculus and renal pelvis 6.   Bilateral orthotopic ureteral orifices  Indication:  Erica Neal is a 42 y.o. female with gross hematuria with negative imaging and poor visualization on cystoscopy.  Description of procedure:  After risks, benefits and alternatives of the procedure discussed the patient detail, informed consent was obtained.  The patient was taken to the operating placed in supine position.  Anesthesia was induced and antibiotics were administered.  The patient was then repositioned in the dorsolithotomy position.  She was prepped and draped in the usual sterile fashion and timeout was performed.  A 23 French rigid cystoscope was placed in the urethral meatus and advanced in the bladder and direct visualization.  There was an area of very mild friable tissue in the left posterior wall.  A cold cup bladder biopsy was used to obtain a specimen and then the Bugbee was used to fulgurate this area.  Hemostasis was adequate with the irrigant turned off.  Next  examination of the ureteral orifices showed clear E flux bilaterally.  The left side appeared very subtlety more yellow in color.  A open-ended ureteral catheter was then used to intubate the left ureteral orifice and a retrograde pyelogram was obtained.  There is a filling defect noted in the left renal pelvis near the UPJ consistent with a kidney stone.  A sensor wire was advanced to the open ureteral catheter and the ureteral catheter was removed.  Semirigid ureteroscopy then took place to the UPJ and there was no abnormality noted in the ureter.  Next a second sensor was placed through the semirigid ureteroscope and the ureteroscope was removed.  1 wire was secured as the safety wire.  The ureteral access sheath was then placed over the second wire and advanced to the proximal ureter with fluoroscopic guidance.  The inner sheath and wire removed.  L[place]ell ureteroscopy then took place.  A 6 and her stone was encountered in the renal pelvis.  A 200 m holmium laser fiber was then used to fragment the stone.  A 0 tip basket was used to remove the fragments.  After all large fragments have been removed the ureteroscope was removed and unison with the access sheath taking care to examine the ureter on the way out.  There is no trauma or injury noted to the ureter.  A double-J 6 French by 24 cm stent with a tether was advanced over the existing wire with fluoroscopic guidance.  A proximal curl was seen with fluoroscopy and a distal curl seen under direct visualization.  Next attention turned to the right ureteral orifice  and a retrograde pyelogram was obtained in standard fashion.  There is no filling defects noted.  Reexamination of the bladder biopsy area showed excellent hemostasis.  The patient's bladder was decompressed and the cystoscope was removed.  The patient was awakened from anesthesia and transferred the PACU in stable condition.  Plan: Patient to remove stent at home in 3 days.

## 2023-01-06 NOTE — Transfer of Care (Addendum)
Immediate Anesthesia Transfer of Care Note  Patient: Erica Neal  Procedure(s) Performed: Procedure(s) (LRB): DIAGNOSTIC CYSTOSCOPY, RIGHT RETROGRADE PYELOGRAM, `LEFT URETEROSCOPY, Left  LASER LITHOTRIPSY Left URETERAL STENT PLACEMENT, BLADDER BIOPSY AND FULGERRATION (Bilateral)  Patient Location: PACU  Anesthesia Type: GA  Level of Consciousness: awake, sedated and sleepy   Airway & Oxygen Therapy: Patient Spontanous Breathing and Patient connected to [place] oxygen  Post-op Assessment: Report given to PACU RN, Post -op Vital signs reviewed and stable and Patient moving all extremities  Post vital signs: Reviewed and stable  Complications: No apparent anesthesia complications

## 2023-01-06 NOTE — Anesthesia Procedure Notes (Signed)
Procedure Name: LMA Insertion Date/Time: 01/06/2023 12:34 PM  Performed by: Dairl Ponder, CRNAPre-anesthesia Checklist: Patient identified, Emergency Drugs available, Suction available and Patient being monitored Patient Re-evaluated:Patient Re-evaluated prior to induction Oxygen Delivery Method: Circle System Utilized Preoxygenation: Pre-oxygenation with 100% oxygen Induction Type: IV induction Ventilation: Mask ventilation without difficulty LMA: LMA inserted LMA Size: 4.0 Number of attempts: 1 Airway Equipment and Method: Bite block Placement Confirmation: positive ETCO2 Tube secured with: Tape Dental Injury: Teeth and Oropharynx as per pre-operative assessment

## 2023-01-08 ENCOUNTER — Encounter (HOSPITAL_BASED_OUTPATIENT_CLINIC_OR_DEPARTMENT_OTHER): Payer: Self-pay | Admitting: Urology

## 2023-04-28 IMAGING — DX DG CHEST 2V
2 series · 2 of 2 positions shown · non-contrast
Comparison: 10/23/2019

CLINICAL DATA: 40-year-old female with morbid obesity, preoperative
chest x-ray

EXAM:
CHEST - 2 VIEW

[chest pa]
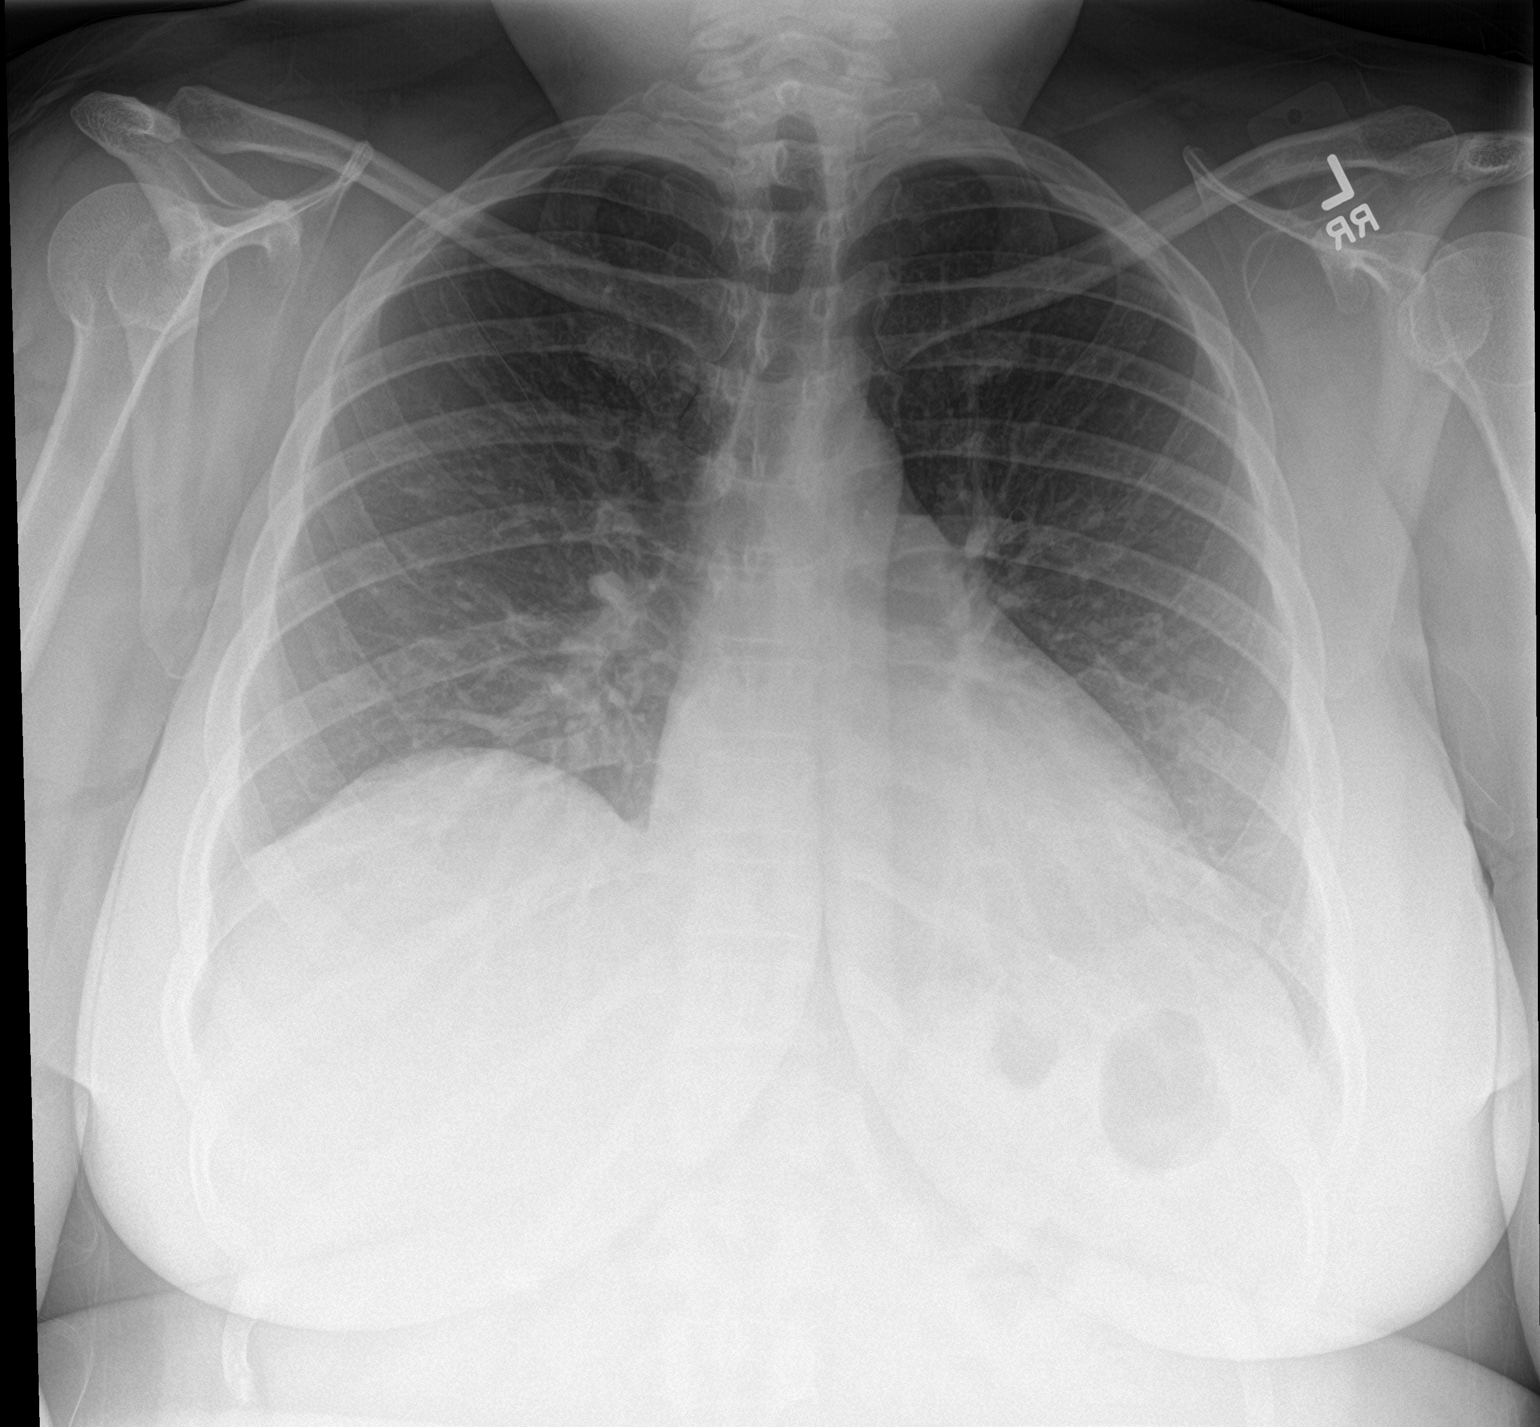

[chest lat]
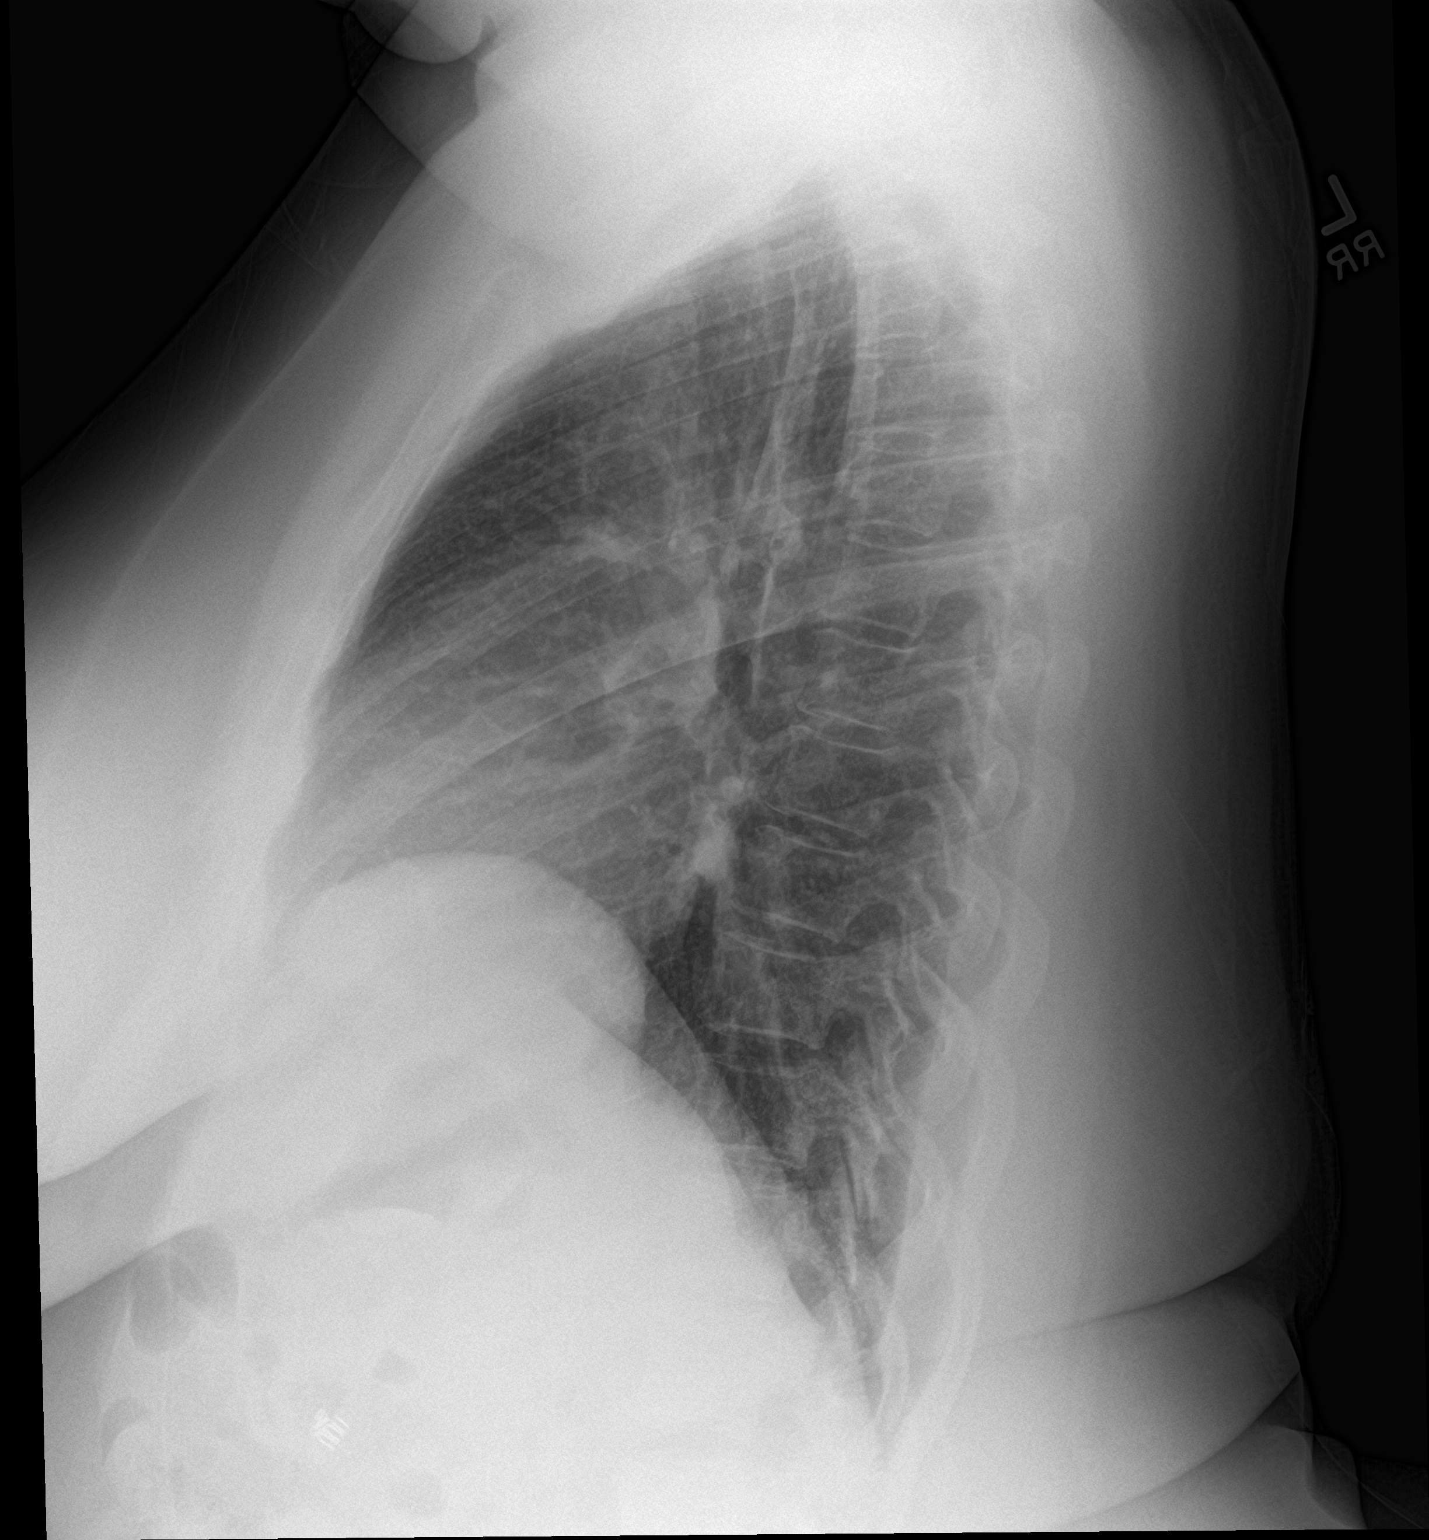

[2 of 2 positions shown; findings below may reference images not displayed]

FINDINGS: The heart size and mediastinal contours are within normal limits.
Both lungs are clear. The visualized skeletal structures are
unremarkable.
IMPRESSION: No active cardiopulmonary disease.

## 2023-05-29 ENCOUNTER — Encounter (HOSPITAL_COMMUNITY): Payer: Self-pay | Admitting: *Deleted

## 2023-06-04 ENCOUNTER — Other Ambulatory Visit: Payer: Self-pay | Admitting: Student

## 2023-06-04 DIAGNOSIS — Z9884 Bariatric surgery status: Secondary | ICD-10-CM

## 2023-06-12 ENCOUNTER — Other Ambulatory Visit: Payer: 59

## 2023-06-15 ENCOUNTER — Ambulatory Visit
Admission: RE | Admit: 2023-06-15 | Discharge: 2023-06-15 | Disposition: A | Discharge status: Home / Self Care | Payer: PRIVATE HEALTH INSURANCE | Source: Ambulatory Visit | Attending: Student

## 2023-06-15 DIAGNOSIS — Z9884 Bariatric surgery status: Secondary | ICD-10-CM

## 2023-06-18 ENCOUNTER — Telehealth: Payer: Self-pay | Admitting: Gastroenterology

## 2023-06-18 NOTE — Telephone Encounter (Signed)
 Good afternoon Dr. Chales Abrahams Supervising MD PM  PT has been referred to [place] for diarrhea. She has been a patient with [facility] and wants to go with her pcp recommendations. Records are in Epic with care everywhere. Please review and advise of scheduling. Thank you.

## 2023-06-22 ENCOUNTER — Encounter: Payer: Self-pay | Admitting: Gastroenterology

## 2023-06-22 NOTE — Telephone Encounter (Signed)
 OK for APP clinic plese [facility]

## 2023-07-07 ENCOUNTER — Encounter: Payer: Self-pay | Admitting: Plastic Surgery

## 2023-07-07 ENCOUNTER — Ambulatory Visit: Payer: PRIVATE HEALTH INSURANCE | Admitting: Plastic Surgery

## 2023-07-07 DIAGNOSIS — Z9884 Bariatric surgery status: Secondary | ICD-10-CM | POA: Diagnosis not present

## 2023-07-07 DIAGNOSIS — M793 Panniculitis, unspecified: Secondary | ICD-10-CM

## 2023-07-07 DIAGNOSIS — F323 Major depressive disorder, single episode, severe with psychotic features: Secondary | ICD-10-CM

## 2023-07-07 NOTE — Progress Notes (Signed)
 Patient ID: Erica Neal, female    DOB: 01/06/1981, 43 y.o.   MRN: [id number]   Chief Complaint  Patient presents with   Advice Only   Skin Problem    The patient is a 43 year old female here for evaluation of her abdomen.  She is 5 feet 1 inch tall and weighs 194 pounds.  She underwent a gastric bypass in July 2023.  She went from 312 pounds to 194 pounds.  She is interested in losing another 30 to 40 pounds.  She complains of neck and back pain secondary to the excess soft tissue and weight of the pannus.  She has a skin breakdown and infections in her skin folds.  She does not have any signs of hernia at this time.    Review of Systems  Constitutional:  Positive for activity change. Negative for appetite change.  HENT: Negative.    Eyes: Negative.   Respiratory: Negative.    Cardiovascular: Negative.   Gastrointestinal: Negative.   Endocrine: Negative.   Genitourinary: Negative.   Musculoskeletal:  Positive for back pain and neck pain.  Skin:  Positive for rash.    Past Medical History:  Diagnosis Date   Anxiety    Arthritis    Bipolar disorder (HCC)    Depression    Dysrhythmia    PVC's on rare occasion, no cardiologist   History of kidney stones    Sleep apnea    no cpap used since weight loss   Wears glasses     Past Surgical History:  Procedure Laterality Date   ABLATION  2022   uterine   BILATERAL SALPINGECTOMY Bilateral 04/09/2020   Procedure: BILATERAL SALPINGECTOMY;  Surgeon: Osborn Coho, MD;  Location: [facility];  Service: Gynecology;  Laterality: Bilateral;  vaginal   BIOPSY  08/30/2021   Procedure: BIOPSY;  Surgeon: Quentin Ore, MD;  Location: [facility];  Service: General;;   BREAST BIOPSY Right 2023   CERVICAL CERCLAGE     yrs ago   CHOLECYSTECTOMY  2022   CYSTOSCOPY N/A 04/09/2020   Procedure: CYSTOSCOPY;  Surgeon: Osborn Coho, MD;  Location: [facility];  Service: Gynecology;  Laterality:  N/A;   CYSTOSCOPY/URETEROSCOPY/HOLMIUM LASER/STENT PLACEMENT Bilateral 01/06/2023   Procedure: DIAGNOSTIC CYSTOSCOPY, RIGHT RETROGRADE PYELOGRAM, `LEFT URETEROSCOPY, Left  LASER LITHOTRIPSY Left URETERAL STENT PLACEMENT, BLADDER BIOPSY AND FULGERRATION;  Surgeon: Noel Christmas, MD;  Location: [facility];  Service: Urology;  Laterality: Bilateral;  75 MINUTES   ESOPHAGOGASTRODUODENOSCOPY N/A 08/30/2021   Procedure: ESOPHAGOGASTRODUODENOSCOPY (EGD);  Surgeon: Quentin Ore, MD;  Location: [facility];  Service: General;  Laterality: N/A;   GASTRIC BYPASS  10/28/2021   LIPOMA RESECTION     UPPER GI ENDOSCOPY N/A 10/28/2021   Procedure: UPPER GI ENDOSCOPY;  Surgeon: Quentin Ore, MD;  Location: [facility];  Service: General;  Laterality: N/A;   VAGINAL HYSTERECTOMY N/A 04/09/2020   Procedure: HYSTERECTOMY VAGINAL;  Surgeon: Osborn Coho, MD;  Location: [facility];  Service: Gynecology;  Laterality: N/A;      Current Outpatient Medications:    acetaminophen (TYLENOL) 500 MG tablet, Take 1,000 mg by mouth every 6 (six) hours as needed., Disp: , Rfl:    clonazePAM (KLONOPIN) 0.5 MG tablet, Take 0.5 mg by mouth in the morning, at noon, and at bedtime., Disp: , Rfl:    eszopiclone (LUNESTA) 1 MG TABS tablet, Take 1 mg by mouth at bedtime. Take immediately before bedtime, Disp: ,  Rfl:    gabapentin (NEURONTIN) 300 MG capsule, Take 600 mg by mouth at bedtime., Disp: , Rfl:    gabapentin (NEURONTIN) 600 MG tablet, Take 600 mg by mouth at bedtime., Disp: , Rfl:    hydrOXYzine (ATARAX) 50 MG tablet, Take 50 mg by mouth every 6 (six) hours as needed for anxiety., Disp: , Rfl:    Lurasidone HCl 60 MG TABS, Take 60 mg by mouth at bedtime., Disp: , Rfl:    oxyCODONE (ROXICODONE) 5 MG immediate release tablet, Take 1 tablet (5 mg total) by mouth every 8 (eight) hours as needed., Disp: 20 tablet, Rfl: 0   PARoxetine (PAXIL) 20 MG tablet, Take 20 mg by mouth every  evening., Disp: , Rfl:    tamsulosin (FLOMAX) 0.4 MG CAPS capsule, Take 1 capsule (0.4 mg total) by mouth at bedtime., Disp: 30 capsule, Rfl: 0   UNABLE TO FIND, Lurisadone 60 mg with food in pm, Disp: , Rfl:    Objective:   There were no vitals filed for this visit.  Physical Exam Vitals and nursing note reviewed.  Constitutional:      Appearance: Normal appearance.  HENT:     Head: Normocephalic and atraumatic.  Cardiovascular:     Rate and Rhythm: Normal rate.     Pulses: Normal pulses.  Pulmonary:     Effort: Pulmonary effort is normal.  Abdominal:     General: There is no distension.     Palpations: Abdomen is soft.  Musculoskeletal:        General: No swelling or tenderness.  Skin:    General: Skin is warm.     Capillary Refill: Capillary refill takes less than 2 seconds.     Coloration: Skin is not jaundiced.  Neurological:     Mental Status: She is alert and oriented to person, place, and time.  Psychiatric:        Mood and Affect: Mood normal.        Behavior: Behavior normal.        Thought Content: Thought content normal.        Judgment: Judgment normal.     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  Risks and complications were reviewed.  Alena Bills Jayland Null, DO

## 2023-07-13 ENCOUNTER — Other Ambulatory Visit: Payer: Self-pay | Admitting: Student

## 2023-07-13 DIAGNOSIS — R1032 Left lower quadrant pain: Secondary | ICD-10-CM

## 2023-07-13 DIAGNOSIS — R1031 Right lower quadrant pain: Secondary | ICD-10-CM

## 2023-07-20 ENCOUNTER — Ambulatory Visit
Admission: RE | Admit: 2023-07-20 | Discharge: 2023-07-20 | Disposition: A | Discharge status: Home / Self Care | Payer: PRIVATE HEALTH INSURANCE | Source: Ambulatory Visit | Attending: Student

## 2023-07-20 DIAGNOSIS — R1031 Right lower quadrant pain: Secondary | ICD-10-CM

## 2023-08-03 ENCOUNTER — Encounter: Payer: Self-pay | Admitting: Plastic Surgery

## 2023-09-01 ENCOUNTER — Ambulatory Visit: Payer: PRIVATE HEALTH INSURANCE | Admitting: Gastroenterology

## 2023-09-28 IMAGING — US US BREAST*R* LIMITED INC AXILLA
1 series · 6 of 6 positions shown · non-contrast
Comparison: Prior exams.

CLINICAL DATA: 40-year-old female presenting for evaluation of a
right breast mass for which biopsy was recommended at an outside
institution. On review of the outside imaging it is unclear if the
subcentimeter mass identified mammographically in the outer right
breast is definitively seen on the provided ultrasound images.
Therefore repeat ultrasound of the right breast is recommended.

EXAM:
ULTRASOUND OF THE RIGHT BREAST

[Series 1: us breast*right* limited inc axilla · 0.08mm/px · 6 of 6 slices shown]
[im 1/6]
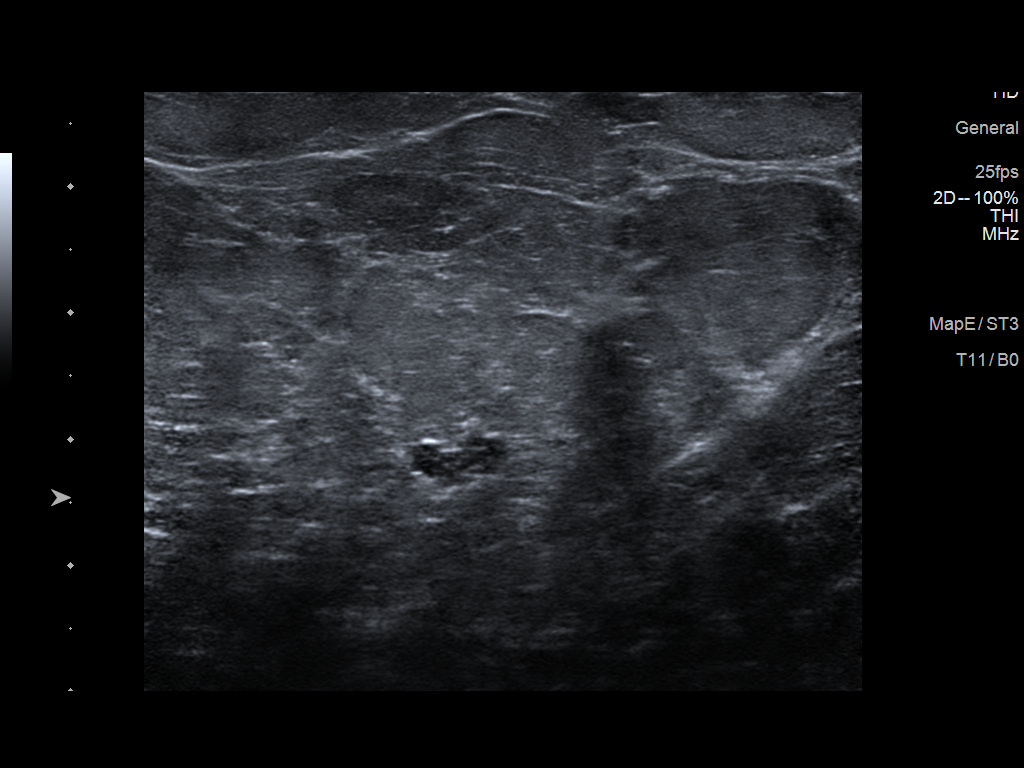
[im 2/6]
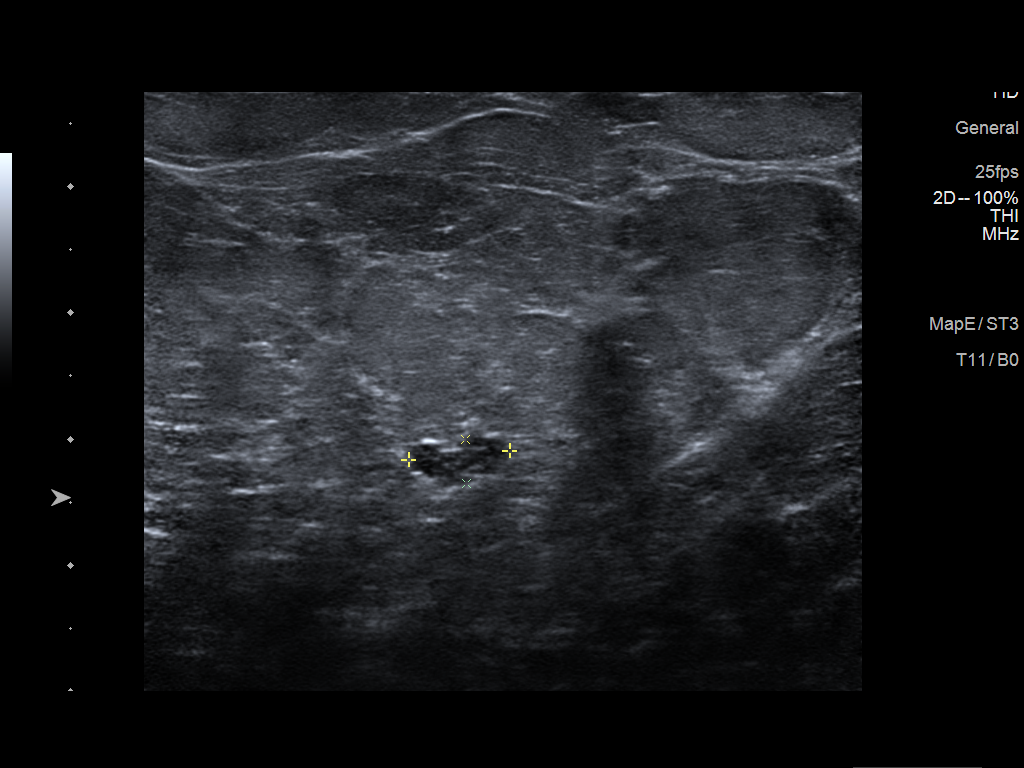
[im 3/6]
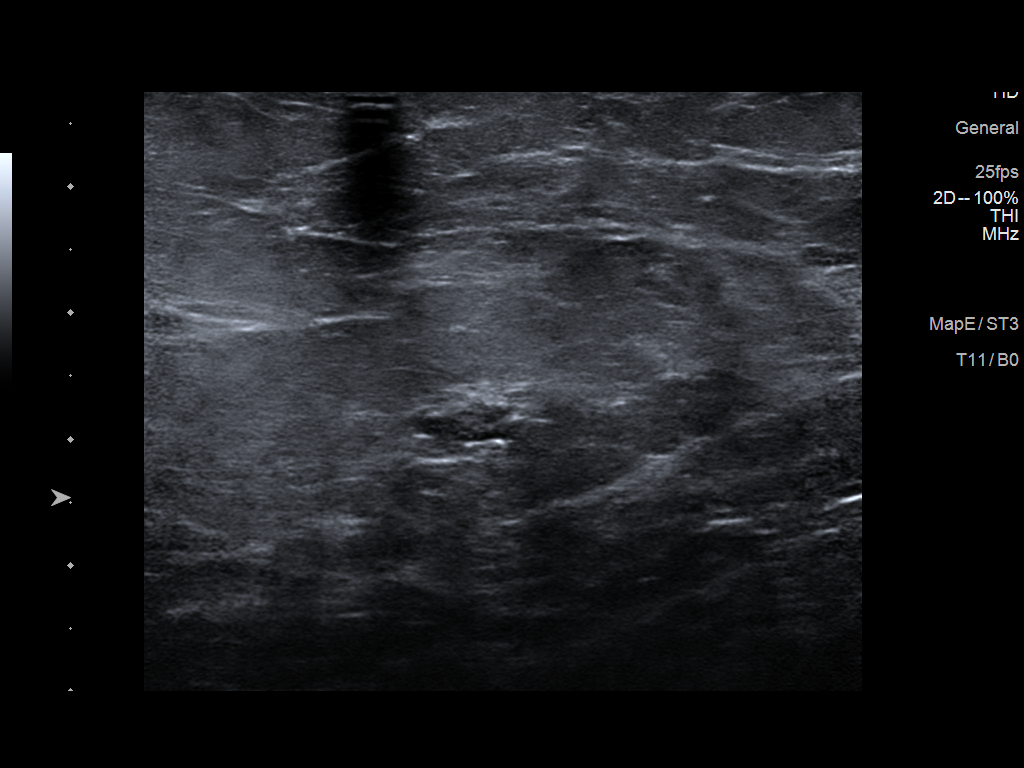
[im 4/6]
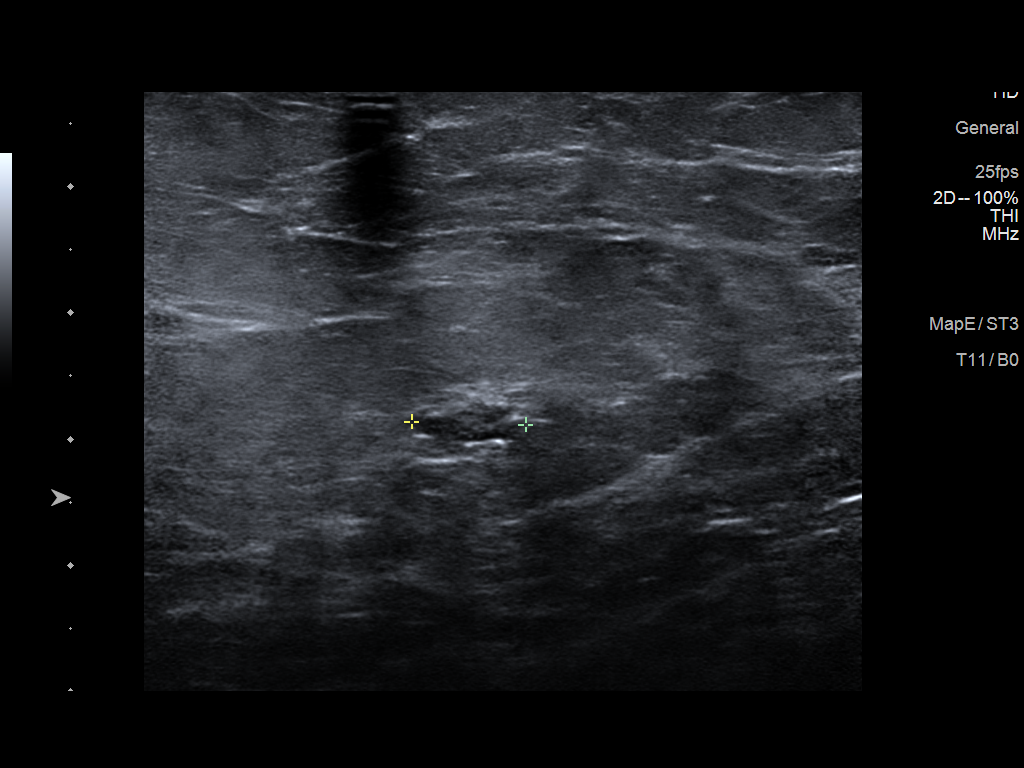
[im 5/6]
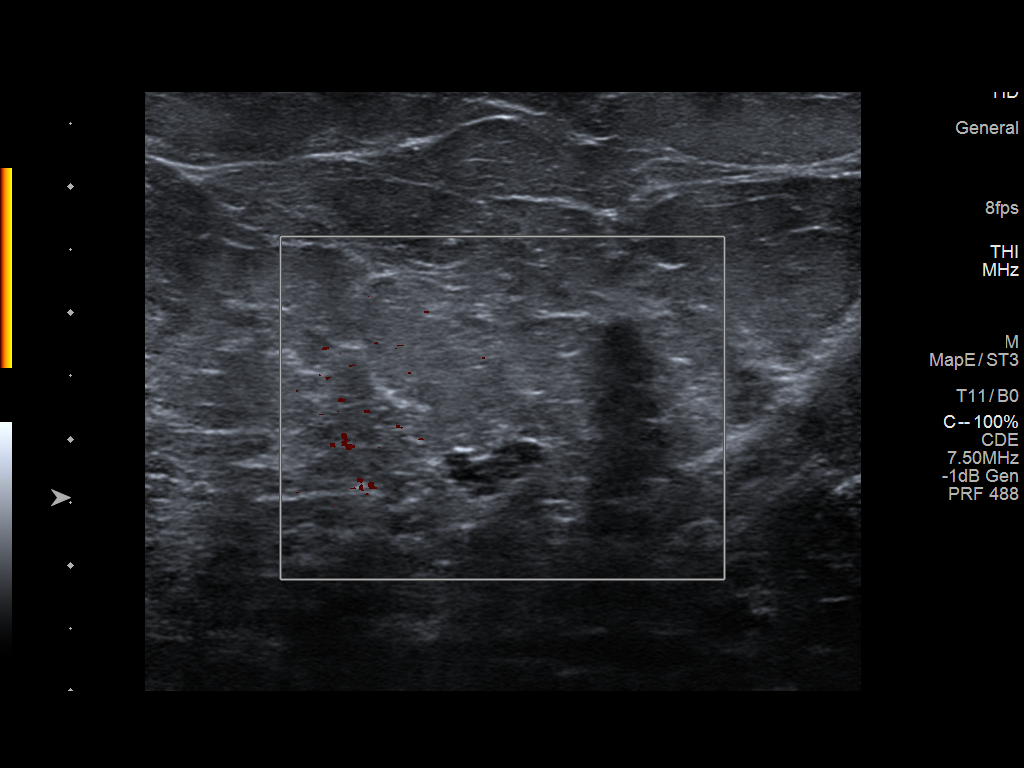
[im 6/6]
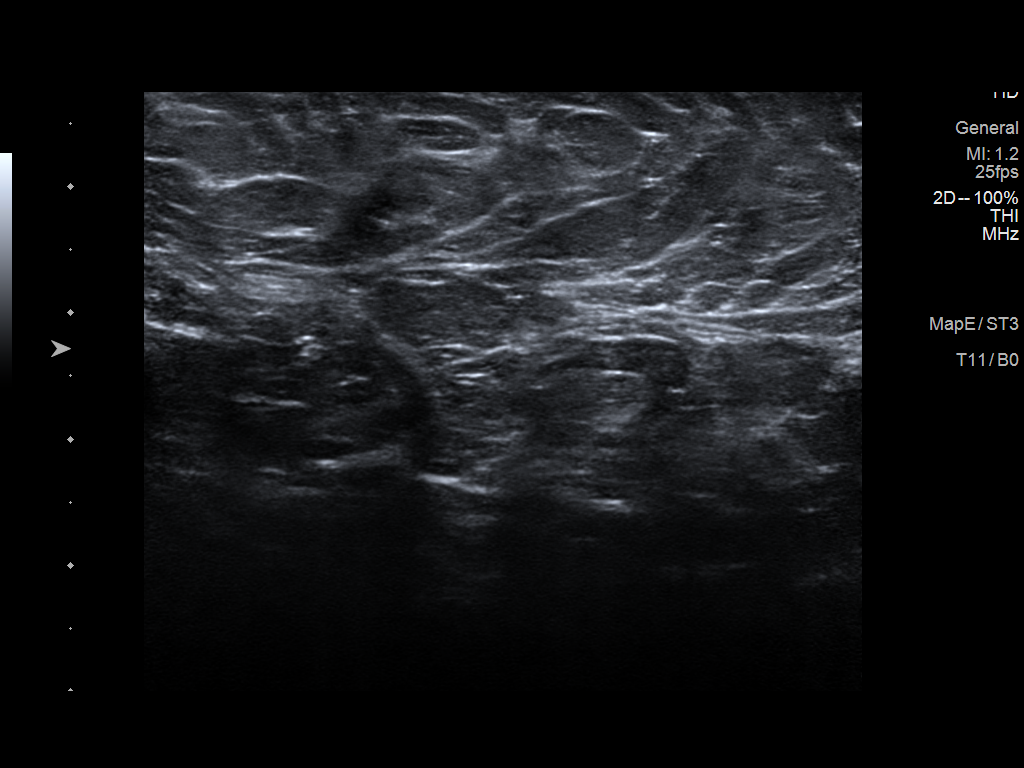

[6 of 6 positions shown; findings below may reference images not displayed]

FINDINGS: Targeted ultrasound is performed in the right breast at 2 o'clock 6
cm from the nipple demonstrating a cluster of cysts measuring 0.8 x
0.4 x 0.9 cm. No internal vascularity. This corresponds to the
mammographic finding.

Targeted ultrasound the right axilla demonstrates normal lymph
nodes.
IMPRESSION: Probably benign mass in the right breast at 2 o'clock, likely a
cluster of cysts.

RECOMMENDATION:
Ultrasound-guided core needle biopsy of the right breast mass at 2
o'clock. In discussion with the patient she prefers to have the mass
biopsied rather than proceed with short-term follow-up.

I have discussed the findings and recommendations with the patient
who agrees to proceed with biopsy. The patient will be scheduled for
the biopsy appointment prior to leaving the office today.

BI-RADS CATEGORY  3: Probably benign.

## 2023-12-08 NOTE — Telephone Encounter (Signed)
 Recall placed

## 2023-12-17 NOTE — Telephone Encounter (Signed)
 Good Morning Dr Charlanne   We received a call from [facility] surgery requesting to schedule patient for a state apt to be seen for IBS symptoms, chronic constipation, and diarrhea.   Patient previous submitted a transfer but has recently been seen with [facility] for an EGD.   Please review and advise on scheduling.   Thank you

## 2023-12-29 NOTE — Telephone Encounter (Signed)
 Needs to follow[facility]up with [facility]
